# Patient Record
Sex: Female | Born: 1955 | Race: White | Hispanic: No | Marital: Married | Smoking: Former smoker
Health system: Southern US, Community
[De-identification: ages and names within clinical notes are randomized; demographics above are authoritative.]

## PROBLEM LIST (undated history)

## (undated) DIAGNOSIS — L409 Psoriasis, unspecified: Secondary | ICD-10-CM

## (undated) DIAGNOSIS — Z8719 Personal history of other diseases of the digestive system: Secondary | ICD-10-CM

## (undated) DIAGNOSIS — R0981 Nasal congestion: Secondary | ICD-10-CM

## (undated) DIAGNOSIS — M1990 Unspecified osteoarthritis, unspecified site: Secondary | ICD-10-CM

## (undated) DIAGNOSIS — M810 Age-related osteoporosis without current pathological fracture: Secondary | ICD-10-CM

## (undated) DIAGNOSIS — E785 Hyperlipidemia, unspecified: Secondary | ICD-10-CM

## (undated) DIAGNOSIS — F419 Anxiety disorder, unspecified: Secondary | ICD-10-CM

## (undated) HISTORY — PX: NOVASURE ABLATION: SHX5394

## (undated) HISTORY — DX: Age-related osteoporosis without current pathological fracture: M81.0

## (undated) HISTORY — DX: Nasal congestion: R09.81

## (undated) HISTORY — DX: Psoriasis, unspecified: L40.9

## (undated) HISTORY — PX: CHOLECYSTECTOMY: SHX55

## (undated) HISTORY — PX: APPENDECTOMY: SHX54

---

## 1986-04-25 HISTORY — PX: EXPLORATORY LAPAROTOMY: SUR591

## 1991-04-26 HISTORY — PX: GALLBLADDER SURGERY: SHX652

## 2001-04-30 ENCOUNTER — Other Ambulatory Visit: Admission: RE | Admit: 2001-04-30 | Discharge: 2001-04-30 | Payer: Self-pay | Admitting: *Deleted

## 2004-03-26 ENCOUNTER — Ambulatory Visit: Payer: Self-pay | Admitting: Internal Medicine

## 2004-04-22 ENCOUNTER — Ambulatory Visit: Payer: Self-pay | Admitting: Internal Medicine

## 2004-06-07 ENCOUNTER — Ambulatory Visit: Payer: Self-pay | Admitting: Internal Medicine

## 2005-05-23 ENCOUNTER — Ambulatory Visit: Payer: Self-pay | Admitting: Internal Medicine

## 2006-01-26 ENCOUNTER — Ambulatory Visit: Payer: Self-pay | Admitting: Internal Medicine

## 2006-08-08 ENCOUNTER — Ambulatory Visit: Payer: Self-pay | Admitting: Internal Medicine

## 2006-08-08 LAB — CONVERTED CEMR LAB
AST: 21 units/L (ref 0–37)
Albumin: 3.8 g/dL (ref 3.5–5.2)
Alkaline Phosphatase: 78 units/L (ref 39–117)
Basophils Absolute: 0 10*3/uL (ref 0.0–0.1)
Basophils Relative: 1 % (ref 0.0–1.0)
CO2: 31 meq/L (ref 19–32)
Chloride: 107 meq/L (ref 96–112)
Cholesterol: 221 mg/dL (ref 0–200)
Creatinine, Ser: 0.6 mg/dL (ref 0.4–1.2)
Eosinophils Relative: 3.6 % (ref 0.0–5.0)
GFR calc Af Amer: 137 mL/min
HCT: 39.7 % (ref 36.0–46.0)
Hemoglobin, Urine: NEGATIVE
Ketones, ur: NEGATIVE mg/dL
MCV: 89.4 fL (ref 78.0–100.0)
Neutro Abs: 2.3 10*3/uL (ref 1.4–7.7)
RBC: 4.44 M/uL (ref 3.87–5.11)
RDW: 11.5 % (ref 11.5–14.6)
Sodium: 142 meq/L (ref 135–145)
Specific Gravity, Urine: 1.025 (ref 1.000–1.03)
Total Bilirubin: 0.9 mg/dL (ref 0.3–1.2)
Total Protein, Urine: NEGATIVE mg/dL
Urine Glucose: NEGATIVE mg/dL
Urobilinogen, UA: 0.2 (ref 0.0–1.0)
VLDL: 26 mg/dL (ref 0–40)
WBC: 4.4 10*3/uL — ABNORMAL LOW (ref 4.5–10.5)
pH: 6 (ref 5.0–8.0)

## 2006-08-11 ENCOUNTER — Ambulatory Visit: Payer: Self-pay | Admitting: Internal Medicine

## 2007-03-28 ENCOUNTER — Telehealth: Payer: Self-pay | Admitting: Internal Medicine

## 2007-03-29 ENCOUNTER — Telehealth: Payer: Self-pay | Admitting: Internal Medicine

## 2007-04-26 LAB — CONVERTED CEMR LAB

## 2007-05-26 ENCOUNTER — Encounter: Payer: Self-pay | Admitting: *Deleted

## 2007-05-26 DIAGNOSIS — E785 Hyperlipidemia, unspecified: Secondary | ICD-10-CM | POA: Insufficient documentation

## 2007-05-26 DIAGNOSIS — Z9089 Acquired absence of other organs: Secondary | ICD-10-CM | POA: Insufficient documentation

## 2007-05-26 DIAGNOSIS — M26609 Unspecified temporomandibular joint disorder, unspecified side: Secondary | ICD-10-CM | POA: Insufficient documentation

## 2007-08-13 ENCOUNTER — Telehealth: Payer: Self-pay | Admitting: Internal Medicine

## 2007-10-25 ENCOUNTER — Telehealth: Payer: Self-pay | Admitting: Internal Medicine

## 2008-01-23 ENCOUNTER — Telehealth: Payer: Self-pay | Admitting: Internal Medicine

## 2008-02-12 ENCOUNTER — Ambulatory Visit: Payer: Self-pay | Admitting: Internal Medicine

## 2008-02-12 DIAGNOSIS — J069 Acute upper respiratory infection, unspecified: Secondary | ICD-10-CM | POA: Insufficient documentation

## 2008-02-12 LAB — CONVERTED CEMR LAB
AST: 24 units/L (ref 0–37)
Albumin: 4.2 g/dL (ref 3.5–5.2)
Alkaline Phosphatase: 77 units/L (ref 39–117)
Basophils Absolute: 0 10*3/uL (ref 0.0–0.1)
Bilirubin, Direct: 0.2 mg/dL (ref 0.0–0.3)
Calcium: 9.2 mg/dL (ref 8.4–10.5)
Creatinine, Ser: 0.5 mg/dL (ref 0.4–1.2)
Eosinophils Absolute: 0.1 10*3/uL (ref 0.0–0.7)
GFR calc Af Amer: 168 mL/min
GFR calc non Af Amer: 139 mL/min
Glucose, Bld: 81 mg/dL (ref 70–99)
MCHC: 35.6 g/dL (ref 30.0–36.0)
Monocytes Absolute: 0.9 10*3/uL (ref 0.1–1.0)
Potassium: 4.3 meq/L (ref 3.5–5.1)
RBC: 4.53 M/uL (ref 3.87–5.11)
RDW: 11.4 % — ABNORMAL LOW (ref 11.5–14.6)
Sodium: 142 meq/L (ref 135–145)
TSH: 1.28 microintl units/mL (ref 0.35–5.50)
Total Bilirubin: 1 mg/dL (ref 0.3–1.2)
Total CHOL/HDL Ratio: 3
WBC: 8.4 10*3/uL (ref 4.5–10.5)

## 2008-02-13 ENCOUNTER — Telehealth: Payer: Self-pay | Admitting: Internal Medicine

## 2008-02-25 ENCOUNTER — Ambulatory Visit: Payer: Self-pay | Admitting: Internal Medicine

## 2008-02-25 DIAGNOSIS — R1011 Right upper quadrant pain: Secondary | ICD-10-CM | POA: Insufficient documentation

## 2008-02-26 ENCOUNTER — Encounter: Payer: Self-pay | Admitting: Internal Medicine

## 2008-03-24 ENCOUNTER — Encounter: Admission: RE | Admit: 2008-03-24 | Discharge: 2008-03-24 | Payer: Self-pay | Admitting: Internal Medicine

## 2008-03-31 ENCOUNTER — Encounter: Payer: Self-pay | Admitting: Internal Medicine

## 2008-04-02 ENCOUNTER — Ambulatory Visit: Payer: Self-pay | Admitting: Internal Medicine

## 2008-04-08 ENCOUNTER — Telehealth: Payer: Self-pay | Admitting: Internal Medicine

## 2008-04-09 ENCOUNTER — Encounter: Payer: Self-pay | Admitting: Internal Medicine

## 2008-04-09 ENCOUNTER — Ambulatory Visit: Payer: Self-pay | Admitting: Internal Medicine

## 2008-04-09 DIAGNOSIS — M5412 Radiculopathy, cervical region: Secondary | ICD-10-CM | POA: Insufficient documentation

## 2008-04-11 ENCOUNTER — Encounter: Admission: RE | Admit: 2008-04-11 | Discharge: 2008-04-11 | Payer: Self-pay | Admitting: Internal Medicine

## 2008-04-13 ENCOUNTER — Encounter: Payer: Self-pay | Admitting: Internal Medicine

## 2008-04-15 ENCOUNTER — Telehealth: Payer: Self-pay | Admitting: Internal Medicine

## 2008-04-16 ENCOUNTER — Ambulatory Visit: Payer: Self-pay | Admitting: Internal Medicine

## 2008-05-01 ENCOUNTER — Encounter: Payer: Self-pay | Admitting: Internal Medicine

## 2008-09-02 ENCOUNTER — Telehealth: Payer: Self-pay | Admitting: Internal Medicine

## 2008-12-02 ENCOUNTER — Ambulatory Visit: Payer: Self-pay | Admitting: Internal Medicine

## 2008-12-24 ENCOUNTER — Ambulatory Visit: Payer: Self-pay | Admitting: Internal Medicine

## 2009-02-26 ENCOUNTER — Ambulatory Visit: Payer: Self-pay | Admitting: Obstetrics and Gynecology

## 2009-03-02 ENCOUNTER — Telehealth: Payer: Self-pay | Admitting: Internal Medicine

## 2009-08-22 ENCOUNTER — Telehealth: Payer: Self-pay | Admitting: Internal Medicine

## 2010-05-16 ENCOUNTER — Encounter: Payer: Self-pay | Admitting: Internal Medicine

## 2010-05-25 NOTE — Progress Notes (Signed)
Summary: REFILLs  Phone Note Refill Request   Refills Requested: Medication #1:  SIMVASTATIN 40 MG TABS 1 by mouth qPM for cholesterol  Medication #2:  ASPIRIN 81 MG  TABS Take 1 tablet by mouth once a day LAST office visit w/Owynn Mosqueda 02/2008. When is pt due for f/u? Ok for refills?   Initial call taken by: Lamar Sprinkles, CMA,  August 22, 2009 11:38 AM  Follow-up for Phone Call        OK for refills. Needs CPX OV with labs Follow-up by: Jacques Navy MD,  Aug 24, 2009 8:41 AM  Additional Follow-up for Phone Call Additional follow up Details #1::        Asprin should have been alprazolam. Ok to fill?  Additional Follow-up by: Lamar Sprinkles, CMA,  Aug 24, 2009 8:45 AM    Additional Follow-up for Phone Call Additional follow up Details #2::    yes. Follow-up by: Jacques Navy MD,  Aug 24, 2009 12:48 PM  Prescriptions: Prudy Feeler 0.25 MG  TABS (ALPRAZOLAM) Take one tablet once daily as needed  #30 x 5   Entered by:   Lamar Sprinkles, CMA   Authorized by:   Jacques Navy MD   Signed by:   Lamar Sprinkles, CMA on 08/24/2009   Method used:   Telephoned to ...       [address]  [place]. [phone]* (retail)       [address]       [place], [place]  [address]       Ph: [phone]       Fax: [phone]   RxID:   [id number] SIMVASTATIN 40 MG TABS (SIMVASTATIN) 1 by mouth qPM for cholesterol  #30 Tablet x 5   Entered by:   Lamar Sprinkles, CMA   Authorized by:   Jacques Navy MD   Signed by:   Lamar Sprinkles, CMA on 08/24/2009   Method used:   Telephoned to ...       [address]  [place]. [phone]* (retail)       [address]       [place], [place]  [address]       Ph: [phone]       Fax: [phone]   RxID:   [id number]

## 2010-09-10 NOTE — Assessment & Plan Note (Signed)
[facility]                           PRIMARY CARE OFFICE NOTE   NAME:TILLEYVincent, Tabitha Knight                   MRN:          [id number]  DATE:08/11/2006                            DOB:          1956/03/04    [name] is a 55 year old woman who presents for evaluation. She was  last seen January 26, 2006 for an upper respiratory infection with  productive cough. She reports the interval history is otherwise  unremarkable.   PAST MEDICAL HISTORY:  Well documented in my note April 22, 2004 with  no significant changes.   SOCIAL HISTORY:  The patient did go to [place] in 2007. She is now in a 4-  year relationship and it seems stable. Her son is now in college  majoring in Armed forces operational officer.   CURRENT MEDICATIONS:  1. Allegra 180 mg daily p.r.n.  2. Zocor 20 mg daily.  3. Xanax 0.25 mg q.6 p.r.n.  4. Flonase as needed.   REVIEW OF SYSTEMS:  Negative for any fevers, sweats, chills or other  constitutional symptoms. The last eye exam was May 2007. ENT,  cardiovascular, and respiratory review are negative. The patient does  have occasional heartburn no requiring medication. No GU complaints. She  does have occasional knee pain. No musculoskeletal complaints.   PHYSICAL EXAMINATION:  VITAL SIGNS:  Temperature was 98.4, blood  pressure 137/88, pulse 115, weight 128.  GENERAL:  This is a well-nourished, well-developed woman in no acute  distress.  HEENT:  Normocephalic, atraumatic. EACs and TMs were unremarkable.  Oropharynx with native dentition in good repair. No buccal or palatal  lesions were noted. The posterior pharynx was clear. The conjunctiva and  sclera was clear. PERRLA, EOMI.  Funduscopic exam was unremarkable.  NECK:  Supple without thyromegaly.  NODES:  No adenopathy was noted in the cervical or supraclavicular  regions.  CHEST:  No CVA tenderness.  LUNGS:  Clear to auscultation and percussion.  BREASTS:  Deferred to gynecology.  CARDIOVASCULAR:  2+ radial pulses, no JVD or carotid bruit. She had a  quiet precordium with a regular rate and rhythm without murmurs, rubs or  gallops.  ABDOMEN:  Soft, no guarding, no rebound. No organosplenomegaly was  appreciated.  PELVIC/RECTAL:  Deferred.  EXTREMITIES:  Without clubbing, cyanosis, edema or deformity.   LABORATORY DATA:  Hemoglobin 14 grams, white count was 4400 with a  normal differential. Chemistries were unremarkable with a glucose of 90.  Kidney function normal with a creatinine of 0.6. Liver functions were  normal. Cholesterol 221, triglycerides 129, HDL was excellent at 77.2.  LDL was 122.6. Thyroid function normal with a TSH of 1.69. Urinalysis  with 5-10 WBCs, 1+ mucous, 11-15 epithelial cells and trace bacteria.   ASSESSMENT/PLAN:  1. Lipids. The patient reports she has not been taking full dose Zocor      and even on this her LDL cholesterol is at reasonable goal for a      woman with low risk for cardiac disease. Plan is for the patient to      continue Zocor.  2. Health maintenance. The patient reports she last  had a Pap smear in      January 2008 with Dr. Logan Bores in [place]. Her last mammogram was      from December 2006 and the patient is going to schedule herself for      a mammogram in followup. The patient will be a candidate in the      next year for colorectal cancer screening with colonoscopy and we      will be happy to arrange this for her at her convenience.   All the patient's prescriptions are renewed.   In summary, this is a very pleasant woman who is medically stable at  this time. I have asked her to return to see me in one year or on a  p.r.n. basis.     Rosalyn Gess Norins, MD  Electronically Signed    MEN/MedQ  DD: 08/14/2006  DT: 08/14/2006  Job #: [id number]   cc:   [name]

## 2011-03-22 ENCOUNTER — Ambulatory Visit: Payer: Self-pay | Admitting: Obstetrics and Gynecology

## 2012-05-02 ENCOUNTER — Ambulatory Visit: Payer: Self-pay | Admitting: Obstetrics and Gynecology

## 2013-06-24 ENCOUNTER — Ambulatory Visit: Payer: Self-pay | Admitting: Obstetrics and Gynecology

## 2013-12-01 DIAGNOSIS — F419 Anxiety disorder, unspecified: Secondary | ICD-10-CM | POA: Insufficient documentation

## 2013-12-01 DIAGNOSIS — J309 Allergic rhinitis, unspecified: Secondary | ICD-10-CM | POA: Insufficient documentation

## 2013-12-01 DIAGNOSIS — L409 Psoriasis, unspecified: Secondary | ICD-10-CM | POA: Insufficient documentation

## 2014-12-22 ENCOUNTER — Other Ambulatory Visit: Payer: Self-pay | Admitting: Internal Medicine

## 2014-12-22 DIAGNOSIS — Z0000 Encounter for general adult medical examination without abnormal findings: Secondary | ICD-10-CM | POA: Insufficient documentation

## 2014-12-22 DIAGNOSIS — Z1231 Encounter for screening mammogram for malignant neoplasm of breast: Secondary | ICD-10-CM

## 2014-12-30 ENCOUNTER — Ambulatory Visit
Admission: RE | Admit: 2014-12-30 | Discharge: 2014-12-30 | Disposition: A | Discharge status: Home / Self Care | Payer: 59 | Source: Ambulatory Visit | Attending: Internal Medicine | Admitting: Internal Medicine

## 2014-12-30 DIAGNOSIS — Z1231 Encounter for screening mammogram for malignant neoplasm of breast: Secondary | ICD-10-CM

## 2015-03-11 ENCOUNTER — Encounter: Payer: Self-pay | Admitting: Internal Medicine

## 2015-12-23 DIAGNOSIS — M810 Age-related osteoporosis without current pathological fracture: Secondary | ICD-10-CM | POA: Insufficient documentation

## 2015-12-23 DIAGNOSIS — R030 Elevated blood-pressure reading, without diagnosis of hypertension: Secondary | ICD-10-CM | POA: Insufficient documentation

## 2016-03-25 ENCOUNTER — Other Ambulatory Visit: Payer: Self-pay | Admitting: Internal Medicine

## 2016-03-25 DIAGNOSIS — Z1231 Encounter for screening mammogram for malignant neoplasm of breast: Secondary | ICD-10-CM

## 2016-04-06 ENCOUNTER — Ambulatory Visit
Admission: RE | Admit: 2016-04-06 | Discharge: 2016-04-06 | Disposition: A | Discharge status: Home / Self Care | Payer: 59 | Source: Ambulatory Visit | Attending: Internal Medicine | Admitting: Internal Medicine

## 2016-04-06 DIAGNOSIS — Z1231 Encounter for screening mammogram for malignant neoplasm of breast: Secondary | ICD-10-CM | POA: Insufficient documentation

## 2016-05-03 ENCOUNTER — Ambulatory Visit: Payer: 59

## 2016-07-21 ENCOUNTER — Encounter: Payer: Self-pay | Admitting: Obstetrics and Gynecology

## 2016-09-20 ENCOUNTER — Encounter: Payer: Self-pay | Admitting: Obstetrics and Gynecology

## 2016-09-20 ENCOUNTER — Ambulatory Visit (INDEPENDENT_AMBULATORY_CARE_PROVIDER_SITE_OTHER): Payer: BLUE CROSS/BLUE SHIELD | Admitting: Obstetrics and Gynecology

## 2016-09-20 VITALS — BP 138/82 | HR 82 | Ht 61.0 in | Wt 131.5 lb

## 2016-09-20 DIAGNOSIS — Z01419 Encounter for gynecological examination (general) (routine) without abnormal findings: Secondary | ICD-10-CM | POA: Diagnosis not present

## 2016-09-20 MED ORDER — ALPRAZOLAM 0.25 MG PO TABS: 0.2500 mg | ORAL_TABLET | Freq: Two times a day (BID) | ORAL | 2 refills | Status: DC | PRN

## 2016-09-20 NOTE — Patient Instructions (Signed)
Place annual gynecologic exam patient instructions here.

## 2016-09-20 NOTE — Progress Notes (Signed)
Subjective:   Tabitha Knight is a 61 y.o. G38P1 Caucasian female here for a routine well-woman exam.  No LMP recorded. Patient has had an ablation.    Current complaints: none PCP: Ouida Sills       doesn't desire labs  Social History: Sexual: heterosexual Marital Status: married Living situation: with spouse Occupation: retired Tobacco/alcohol: smoking 4-5 cigs/days, and occasional alcohol intake. Illicit drugs: no history of illicit drug use  The following portions of the patient's history were reviewed and updated as appropriate: allergies, current medications, past family history, past medical history, past social history, past surgical history and problem list.  Past Medical History Past Medical History:  Diagnosis Date  . Osteoporosis   . Psoriasis   . Sinus congestion     Past Surgical History Past Surgical History:  Procedure Laterality Date  . [place]    Gynecologic History G1P1  No LMP recorded. Patient has had an ablation. Contraception: post menopausal status Last Pap: 2014. Results were: normal Last mammogram: 2018. Results were: normal   Obstetric History OB History  Gravida Para Term Preterm AB Living  1 1          SAB TAB Ectopic Multiple Live Births               # Outcome Date GA Lbr Len/2nd Weight Sex Delivery Anes PTL Lv  1 Para 1988    M Vag-Spont         Current Medications No current outpatient prescriptions on file prior to visit.   No current facility-administered medications on file prior to visit.     Review of Systems Patient denies any headaches, blurred vision, shortness of breath, chest pain, abdominal pain, problems with bowel movements, urination, or intercourse.  Objective:  BP 138/82   Pulse 82   Ht 5\' 1"  (1.549 m)   Wt 131 lb 8 oz (59.6 kg)   BMI 24.85 kg/m  Physical Exam  General:  Well developed, well nourished, no acute distress. She is alert and oriented x3. Skin:  Warm and dry Neck:  Midline  trachea, no thyromegaly or nodules Cardiovascular: Regular rate and rhythm, no murmur heard Lungs:  Effort normal, all lung fields clear to auscultation bilaterally Breasts:  No dominant palpable mass, retraction, or nipple discharge Abdomen:  Soft, non tender, no hepatosplenomegaly or masses Pelvic:  External genitalia is normal in appearance.  The vagina is normal in appearance. The cervix is bulbous, no CMT.  Thin prep pap is not done. Uterus is felt to be normal size, shape, and contour.  No adnexal masses or tenderness noted. Extremities:  No swelling or varicosities noted Psych:  She has a normal mood and affect  Assessment:   Healthy well-woman exam S/p menopause Elevated cholesterol Anxiety    Plan:   F/U 1 year for AE, or sooner if needed   Keen Ewalt Rockney Ghee, CNM

## 2017-02-16 ENCOUNTER — Other Ambulatory Visit: Payer: Self-pay | Admitting: *Deleted

## 2017-02-16 MED ORDER — ALPRAZOLAM 0.25 MG PO TABS: 0.2500 mg | ORAL_TABLET | Freq: Two times a day (BID) | ORAL | 2 refills | Status: DC | PRN

## 2017-02-24 ENCOUNTER — Other Ambulatory Visit: Payer: Self-pay | Admitting: Internal Medicine

## 2017-02-24 DIAGNOSIS — Z1231 Encounter for screening mammogram for malignant neoplasm of breast: Secondary | ICD-10-CM

## 2017-06-12 ENCOUNTER — Telehealth: Payer: Self-pay | Admitting: Obstetrics and Gynecology

## 2017-06-12 NOTE — Telephone Encounter (Signed)
The patient called and stated that she is waiting on approval for her prescription at [facility]. The patient would like a call back. Please advise.

## 2017-06-14 ENCOUNTER — Ambulatory Visit
Admission: RE | Admit: 2017-06-14 | Discharge: 2017-06-14 | Disposition: A | Discharge status: Home / Self Care | Payer: BLUE CROSS/BLUE SHIELD | Source: Ambulatory Visit | Attending: Internal Medicine | Admitting: Internal Medicine

## 2017-06-14 ENCOUNTER — Telehealth: Payer: Self-pay | Admitting: Obstetrics and Gynecology

## 2017-06-14 DIAGNOSIS — Z1231 Encounter for screening mammogram for malignant neoplasm of breast: Secondary | ICD-10-CM | POA: Insufficient documentation

## 2017-06-14 NOTE — Telephone Encounter (Signed)
Pt aware MNS out of office until 07/04/17. She request another provider refill her xanax. Advised I will ask.

## 2017-06-14 NOTE — Telephone Encounter (Signed)
The patient called and stated that she is waiting on approval for her prescription at [facility]. The patient would like a call back. Please advise.   This message was sent back on Monday and the patient did not get a response. The patient would like to speak with a nurse if possible to get this taken care of.

## 2017-06-15 MED ORDER — ALPRAZOLAM 0.25 MG PO TABS: 0.2500 mg | ORAL_TABLET | Freq: Two times a day (BID) | ORAL | 0 refills | Status: DC | PRN

## 2017-06-15 NOTE — Telephone Encounter (Signed)
Pt aware rx left up front for p/u.

## 2017-06-15 NOTE — Telephone Encounter (Signed)
May have single refill until MNS returns to office. JML

## 2017-06-19 NOTE — Telephone Encounter (Signed)
Done by [facility]

## 2017-08-19 ENCOUNTER — Other Ambulatory Visit: Payer: Self-pay | Admitting: Certified Nurse Midwife

## 2017-09-11 ENCOUNTER — Telehealth: Payer: Self-pay | Admitting: Obstetrics and Gynecology

## 2017-09-11 ENCOUNTER — Other Ambulatory Visit: Payer: Self-pay | Admitting: *Deleted

## 2017-09-11 MED ORDER — ALPRAZOLAM 0.25 MG PO TABS: 0.2500 mg | ORAL_TABLET | Freq: Two times a day (BID) | ORAL | 2 refills | Status: DC | PRN

## 2017-09-11 NOTE — Telephone Encounter (Signed)
The patient called and stated that she would like to have her medication refilled ALPRAZolam (XANAX) 0.25 MG tablet, The patient would like a call back as well if possible this afternoon to confirm the refill. Please advise.

## 2017-09-11 NOTE — Telephone Encounter (Signed)
MNS out of office 09/11/17, will have MNS sign tomorrow

## 2017-09-21 ENCOUNTER — Encounter: Payer: Self-pay | Admitting: Obstetrics and Gynecology

## 2017-09-21 ENCOUNTER — Other Ambulatory Visit: Payer: Self-pay | Admitting: Obstetrics and Gynecology

## 2017-09-21 ENCOUNTER — Ambulatory Visit (INDEPENDENT_AMBULATORY_CARE_PROVIDER_SITE_OTHER): Payer: BLUE CROSS/BLUE SHIELD | Admitting: Obstetrics and Gynecology

## 2017-09-21 VITALS — BP 137/83 | HR 85 | Ht 61.0 in | Wt 127.3 lb

## 2017-09-21 DIAGNOSIS — Z01419 Encounter for gynecological examination (general) (routine) without abnormal findings: Secondary | ICD-10-CM

## 2017-09-21 NOTE — Progress Notes (Signed)
Subjective:   Tabitha Knight is a 61 y.o. G78P1 white female here for a routine well-woman exam.  No LMP recorded. Patient has had an ablation.    Current complaints: none PCP: Ouida Sills         Social History: Sexual: heterosexual Marital Status: married Living situation: with spouse Occupation: retired Tobacco/alcohol: no tobacco use Illicit drugs: no history of illicit drug use  The following portions of the patient's history were reviewed and updated as appropriate: allergies, current medications, past family history, past medical history, past social history, past surgical history and problem list.  Past Medical History Past Medical History:  Diagnosis Date  . Osteoporosis   . Psoriasis   . Sinus congestion     Past Surgical History Past Surgical History:  Procedure Laterality Date  . [place]    Gynecologic History G1P1  No LMP recorded. Patient has had an ablation. Contraception: post menopausal status Last Pap: ?Marland Kitchen Results were: normal Last mammogram: 05/2017. Results were: normal   Obstetric History OB History  Gravida Para Term Preterm AB Living  1 1          SAB TAB Ectopic Multiple Live Births               # Outcome Date GA Lbr Len/2nd Weight Sex Delivery Anes PTL Lv  1 Para 1988    M Vag-Spont       Current Medications Current Outpatient Medications on File Prior to Visit  Medication Sig Dispense Refill  . alendronate (FOSAMAX) 70 MG tablet Take 70 mg by mouth once a week. Take with a full glass of water on an empty stomach.    . ALPRAZolam (XANAX) 0.25 MG tablet Take 1 tablet (0.25 mg total) by mouth 2 (two) times daily as needed for anxiety. 45 tablet 2  . atorvastatin (LIPITOR) 80 MG tablet Take 80 mg by mouth daily.    . clobetasol ointment (TEMOVATE) 0.53 % Apply 1 application topically 2 (two) times daily.    Marland Kitchen desonide (DESOWEN) 0.05 % cream Apply topically 2 (two) times daily.    . fexofenadine (ALLEGRA) 60 MG tablet Take  60 mg by mouth 2 (two) times daily.    . Vitamin D, Cholecalciferol, 1000 units CAPS Take by mouth.     No current facility-administered medications on file prior to visit.     Review of Systems Patient denies any headaches, blurred vision, shortness of breath, chest pain, abdominal pain, problems with bowel movements, urination, or intercourse.  Objective:  BP 137/83   Pulse 85   Ht 5\' 1"  (1.549 m)   Wt 127 lb 4.8 oz (57.7 kg)   BMI 24.05 kg/m  Physical Exam  General:  Well developed, well nourished, no acute distress. She is alert and oriented x3. Skin:  Warm and dry Neck:  Midline trachea, no thyromegaly or nodules Cardiovascular: Regular rate and rhythm, no murmur heard Lungs:  Effort normal, all lung fields clear to auscultation bilaterally Breasts:  No dominant palpable mass, retraction, or nipple discharge Abdomen:  Soft, non tender, no hepatosplenomegaly or masses Pelvic:  External genitalia is normal in appearance.  The vagina is normal in appearance. The cervix is bulbous, no CMT.  Thin prep pap is done with HR HPV cotesting. Uterus is felt to be normal size, shape, and contour.  No adnexal masses or tenderness noted. Extremities:  No swelling or varicosities noted Psych:  She has a normal mood and affect  Assessment:  Healthy well-woman exam S/p menopause Osteopenia Elevated cholesterol  Plan:  Pap obtained will follow up accordingly F/U 1 year for AE, or sooner if needed Mammogram done this year Colonoscopy & BDS due this year-PCP typically  Melody Rockney Ghee, CNM

## 2017-09-22 LAB — CYTOLOGY - PAP

## 2018-02-01 ENCOUNTER — Other Ambulatory Visit: Payer: Self-pay | Admitting: Obstetrics and Gynecology

## 2018-08-09 ENCOUNTER — Other Ambulatory Visit: Payer: Self-pay | Admitting: Obstetrics and Gynecology

## 2018-08-13 ENCOUNTER — Telehealth: Payer: Self-pay | Admitting: Obstetrics and Gynecology

## 2018-08-13 NOTE — Telephone Encounter (Signed)
The patient called and stated that she is currently out of refills for her prescription ALPRAZolam (XANAX) 0.25 MG tablet [324]. The patient is requesting to have a refill sent into her pharmacy. Please advise.

## 2018-09-24 ENCOUNTER — Telehealth: Payer: Self-pay

## 2018-09-24 NOTE — Telephone Encounter (Signed)
Coronavirus (COVID-19) Are you at risk?  Are you at risk for the Coronavirus (COVID-19)?  To be considered HIGH RISK for Coronavirus (COVID-19), you have to meet the following criteria:  . Traveled to [place], [place], [place], [place] or [place]; or in the [place] to [place], [place], [place], or [place]; and have fever, cough, and shortness of breath within the last 2 weeks of travel OR . Been in close contact with a person diagnosed with COVID-19 within the last 2 weeks and have fever, cough, and shortness of breath . IF YOU DO NOT MEET THESE CRITERIA, YOU ARE CONSIDERED LOW RISK FOR COVID-19.  What to do if you are HIGH RISK for COVID-19?  Marland Kitchen If you are having a medical emergency, call 911. . Seek medical care right away. Before you go to a doctor's office, urgent care or emergency department, call ahead and tell them about your recent travel, contact with someone diagnosed with COVID-19, and your symptoms. You should receive instructions from your physician's office regarding next steps of care.  . When you arrive at healthcare provider, tell the healthcare staff immediately you have returned from visiting [place], [place], [place], [place] or [place]; or traveled in the [place] to [place], [place], [place], or [place]; in the last two weeks or you have been in close contact with a person diagnosed with COVID-19 in the last 2 weeks.   . Tell the health care staff about your symptoms: fever, cough and shortness of breath. . After you have been seen by a medical provider, you will be either: o Tested for (COVID-19) and discharged home on quarantine except to seek medical care if symptoms worsen, and asked to  - Stay home and avoid contact with others until you get your results (4-5 days)  - Avoid travel on public transportation if possible (such as bus, train, or airplane) or o Sent to the Emergency Department by EMS for evaluation, COVID-19 testing, and possible  admission depending on your condition and test results.  What to do if you are LOW RISK for COVID-19?  Reduce your risk of any infection by using the same precautions used for avoiding the common cold or flu:  Marland Kitchen Wash your hands often with soap and warm water for at least 20 seconds.  If soap and water are not readily available, use an alcohol-based hand sanitizer with at least 60% alcohol.  . If coughing or sneezing, cover your mouth and nose by coughing or sneezing into the elbow areas of your shirt or coat, into a tissue or into your sleeve (not your hands). . Avoid shaking hands with others and consider head nods or verbal greetings only. . Avoid touching your eyes, nose, or mouth with unwashed hands.  . Avoid close contact with people who are Tabitha Knight. . Avoid places or events with large numbers of people in one location, like concerts or sporting events. . Carefully consider travel plans you have or are making. . If you are planning any travel outside or inside the [place], visit the CDC's Travelers' Health webpage for the latest health notices. . If you have some symptoms but not all symptoms, continue to monitor at home and seek medical attention if your symptoms worsen. . If you are having a medical emergency, call 911.  09/24/18 SCREENING NEG SLS ADDITIONAL HEALTHCARE OPTIONS FOR PATIENTS  [place] [facility] / e-Visit: [email or web address]         [facility]: [phone]  [facility]: [facility]: [phone]

## 2018-09-25 ENCOUNTER — Ambulatory Visit (INDEPENDENT_AMBULATORY_CARE_PROVIDER_SITE_OTHER): Payer: BC Managed Care – PPO | Admitting: Obstetrics and Gynecology

## 2018-09-25 ENCOUNTER — Other Ambulatory Visit: Payer: Self-pay

## 2018-09-25 ENCOUNTER — Encounter: Payer: Self-pay | Admitting: Obstetrics and Gynecology

## 2018-09-25 VITALS — BP 132/84 | HR 88 | Ht 61.0 in | Wt 130.7 lb

## 2018-09-25 DIAGNOSIS — Z01419 Encounter for gynecological examination (general) (routine) without abnormal findings: Secondary | ICD-10-CM

## 2018-09-25 MED ORDER — ALPRAZOLAM 0.25 MG PO TABS: 0.2500 mg | ORAL_TABLET | Freq: Two times a day (BID) | ORAL | 4 refills | Status: DC | PRN

## 2018-09-25 NOTE — Progress Notes (Signed)
Subjective:   Tabitha Knight is a 63 y.o. G46P1 Caucasian female here for a routine well-woman exam.  No LMP recorded. Patient has had an ablation.    Current complaints: none PCP: Ouida Sills       PCP does labs  Social History: Sexual: heterosexual Marital Status: married Living situation: with spouse Occupation: retired Tobacco/alcohol: no tobacco use Illicit drugs: no history of illicit drug use  The following portions of the patient's history were reviewed and updated as appropriate: allergies, current medications, past family history, past medical history, past social history, past surgical history and problem list.  Past Medical History Past Medical History:  Diagnosis Date  . Osteoporosis   . Psoriasis   . Sinus congestion     Past Surgical History Past Surgical History:  Procedure Laterality Date  . [place]    Gynecologic History G1P1  No LMP recorded. Patient has had an ablation. Contraception: post menopausal status Last Pap: 2019. Results were: normal Last mammogram: 05/2017. Results were: normal   Obstetric History OB History  Gravida Para Term Preterm AB Living  1 1          SAB TAB Ectopic Multiple Live Births               # Outcome Date GA Lbr Len/2nd Weight Sex Delivery Anes PTL Lv  1 Para 1988    M Vag-Spont       Current Medications Current Outpatient Medications on File Prior to Visit  Medication Sig Dispense Refill  . ALPRAZolam (XANAX) 0.25 MG tablet TAKE 1 TABLET BY MOUTH TWICE DAILY AS NEEDED FOR ANXIETY 45 tablet 0  . atorvastatin (LIPITOR) 80 MG tablet Take 80 mg by mouth daily.    . fexofenadine (ALLEGRA) 60 MG tablet Take 60 mg by mouth 2 (two) times daily.    Marland Kitchen gabapentin (NEURONTIN) 100 MG capsule Take 100 mg by mouth 3 (three) times daily.    Marland Kitchen pyridOXINE (VITAMIN B-6) 100 MG tablet Take 100 mg by mouth daily.    . Vitamin D, Cholecalciferol, 1000 units CAPS Take by mouth.    Marland Kitchen alendronate (FOSAMAX) 70 MG tablet  Take 70 mg by mouth once a week. Take with a full glass of water on an empty stomach.    . clobetasol ointment (TEMOVATE) 2.84 % Apply 1 application topically 2 (two) times daily.    Marland Kitchen desonide (DESOWEN) 0.05 % cream Apply topically 2 (two) times daily.     No current facility-administered medications on file prior to visit.     Review of Systems Patient denies any headaches, blurred vision, shortness of breath, chest pain, abdominal pain, problems with bowel movements, urination, or intercourse.  Objective:  BP 132/84   Pulse 88   Ht 5\' 1"  (1.549 m)   Wt 130 lb 11.2 oz (59.3 kg)   BMI 24.70 kg/m  Physical Exam  General:  Well developed, well nourished, no acute distress. She is alert and oriented x3. Skin:  Warm and dry Neck:  Midline trachea, no thyromegaly or nodules Cardiovascular: Regular rate and rhythm, no murmur heard Lungs:  Effort normal, all lung fields clear to auscultation bilaterally Breasts:  No dominant palpable mass, retraction, or nipple discharge Abdomen:  Soft, non tender, no hepatosplenomegaly or masses Pelvic:  External genitalia is normal in appearance.  The vagina is normal in appearance. The cervix is bulbous, no CMT.  Thin prep pap is not done . Uterus is felt to be normal size, shape,  and contour.  No adnexal masses or tenderness noted. Extremities:  No swelling or varicosities noted Psych:  She has a normal mood and affect  Assessment:   Healthy well-woman exam  Plan:   F/U 1 year for AE, or sooner if needed Mammogram due  Payden Docter Rockney Ghee, CNM

## 2018-10-22 ENCOUNTER — Other Ambulatory Visit: Payer: Self-pay | Admitting: Orthopedic Surgery

## 2018-10-22 DIAGNOSIS — M5412 Radiculopathy, cervical region: Secondary | ICD-10-CM

## 2018-10-25 ENCOUNTER — Ambulatory Visit
Admission: RE | Admit: 2018-10-25 | Discharge: 2018-10-25 | Disposition: A | Discharge status: Home / Self Care | Payer: BC Managed Care – PPO | Source: Ambulatory Visit | Attending: Orthopedic Surgery | Admitting: Orthopedic Surgery

## 2018-10-25 ENCOUNTER — Other Ambulatory Visit: Payer: Self-pay

## 2018-10-25 DIAGNOSIS — M5412 Radiculopathy, cervical region: Secondary | ICD-10-CM | POA: Insufficient documentation

## 2018-11-06 ENCOUNTER — Ambulatory Visit: Payer: BLUE CROSS/BLUE SHIELD

## 2019-01-24 ENCOUNTER — Encounter: Payer: Self-pay | Admitting: Gastroenterology

## 2019-01-25 ENCOUNTER — Other Ambulatory Visit: Payer: Self-pay | Admitting: Internal Medicine

## 2019-01-25 DIAGNOSIS — Z1231 Encounter for screening mammogram for malignant neoplasm of breast: Secondary | ICD-10-CM

## 2019-02-28 ENCOUNTER — Other Ambulatory Visit: Payer: Self-pay | Admitting: Neurosurgery

## 2019-03-06 ENCOUNTER — Ambulatory Visit
Admission: RE | Admit: 2019-03-06 | Discharge: 2019-03-06 | Disposition: A | Discharge status: Home / Self Care | Payer: BC Managed Care – PPO | Source: Ambulatory Visit | Attending: Internal Medicine | Admitting: Internal Medicine

## 2019-03-06 DIAGNOSIS — Z1231 Encounter for screening mammogram for malignant neoplasm of breast: Secondary | ICD-10-CM | POA: Diagnosis not present

## 2019-03-08 ENCOUNTER — Other Ambulatory Visit: Payer: Self-pay

## 2019-03-08 ENCOUNTER — Other Ambulatory Visit
Admission: RE | Admit: 2019-03-08 | Discharge: 2019-03-08 | Disposition: A | Discharge status: Home / Self Care | Payer: BC Managed Care – PPO | Source: Ambulatory Visit | Attending: Neurosurgery | Admitting: Neurosurgery

## 2019-03-08 DIAGNOSIS — Z01818 Encounter for other preprocedural examination: Secondary | ICD-10-CM | POA: Insufficient documentation

## 2019-03-08 DIAGNOSIS — Z20828 Contact with and (suspected) exposure to other viral communicable diseases: Secondary | ICD-10-CM | POA: Diagnosis not present

## 2019-03-08 HISTORY — DX: Hyperlipidemia, unspecified: E78.5

## 2019-03-08 HISTORY — DX: Unspecified osteoarthritis, unspecified site: M19.90

## 2019-03-08 LAB — CBC
HCT: 41.9 % (ref 36.0–46.0)
Hemoglobin: 14.9 g/dL (ref 12.0–15.0)
MCH: 31.6 pg (ref 26.0–34.0)
MCHC: 35.6 g/dL (ref 30.0–36.0)
MCV: 88.8 fL (ref 80.0–100.0)
Platelets: 210 10*3/uL (ref 150–400)
RBC: 4.72 MIL/uL (ref 3.87–5.11)
RDW: 11.2 % — ABNORMAL LOW (ref 11.5–15.5)
WBC: 5.8 10*3/uL (ref 4.0–10.5)
nRBC: 0 % (ref 0.0–0.2)

## 2019-03-08 LAB — URINALYSIS, ROUTINE W REFLEX MICROSCOPIC
Bilirubin Urine: NEGATIVE
Glucose, UA: NEGATIVE mg/dL
Hgb urine dipstick: NEGATIVE
Ketones, ur: NEGATIVE mg/dL
Leukocytes,Ua: NEGATIVE
Nitrite: NEGATIVE
Protein, ur: NEGATIVE mg/dL
Specific Gravity, Urine: 1.002 — ABNORMAL LOW (ref 1.005–1.030)
pH: 7 (ref 5.0–8.0)

## 2019-03-08 LAB — BASIC METABOLIC PANEL
Anion gap: 11 (ref 5–15)
BUN: 5 mg/dL — ABNORMAL LOW (ref 8–23)
CO2: 26 mmol/L (ref 22–32)
Calcium: 9 mg/dL (ref 8.9–10.3)
Chloride: 102 mmol/L (ref 98–111)
Creatinine, Ser: 0.51 mg/dL (ref 0.44–1.00)
GFR calc Af Amer: >60 mL/min (ref >60)
GFR calc non Af Amer: >60 mL/min (ref >60)
Glucose, Bld: 106 mg/dL — ABNORMAL HIGH (ref 70–99)
Potassium: 3.4 mmol/L — ABNORMAL LOW (ref 3.5–5.1)
Sodium: 139 mmol/L (ref 135–145)

## 2019-03-08 LAB — TYPE AND SCREEN
ABO/RH(D): B NEG
Antibody Screen: NEGATIVE

## 2019-03-08 LAB — PROTIME-INR
INR: 1 (ref 0.8–1.2)
Prothrombin Time: 12.7 seconds (ref 11.4–15.2)

## 2019-03-08 LAB — APTT: aPTT: 25 seconds (ref 24–36)

## 2019-03-08 LAB — SURGICAL PCR SCREEN
MRSA, PCR: NEGATIVE
Staphylococcus aureus: NEGATIVE

## 2019-03-08 NOTE — Patient Instructions (Signed)
Your procedure is scheduled on: Wednesday 03/13/19 Report to [place]. To find out your arrival time please call [phone] between 1PM - 3PM on Tuesday 03/12/19.  Remember: Instructions that are not followed completely may result in serious medical risk, up to and including death, or upon the discretion of your surgeon and anesthesiologist your surgery may need to be rescheduled.     _X__ 1. Do not eat food after midnight the night before your procedure.                 No gum chewing or hard candies. You may drink clear liquids up to 2 hours                 before you are scheduled to arrive for your surgery- DO not drink clear                 liquids within 2 hours of the start of your surgery.                 Clear Liquids include:  water, apple juice without pulp, clear carbohydrate                 drink such as Clearfast or Gatorade, Black Coffee or Tea (Do not add                 anything to coffee or tea). Diabetics water only  __X__2.  On the morning of surgery brush your teeth with toothpaste and water, you                 may rinse your mouth with mouthwash if you wish.  Do not swallow any              toothpaste of mouthwash.     _X__ 3.  No Alcohol for 24 hours before or after surgery.   _X__ 4.  Do Not Smoke or use e-cigarettes For 24 Hours Prior to Your Surgery.                 Do not use any chewable tobacco products for at least 6 hours prior to                 surgery.  ____  5.  Bring all medications with you on the day of surgery if instructed.   __X__  6.  Notify your doctor if there is any change in your medical condition      (cold, fever, infections).     Do not wear jewelry, make-up, hairpins, clips or nail polish. Do not wear lotions, powders, or perfumes.  Do not shave 48 hours prior to surgery. Men may shave face and neck. Do not bring valuables to the hospital.    [facility] is not responsible  for any belongings or valuables.  Contacts, dentures/partials or body piercings may not be worn into surgery. Bring a case for your contacts, glasses or hearing aids, a denture cup will be supplied. Leave your suitcase in the car. After surgery it may be brought to your room. For patients admitted to the hospital, discharge time is determined by your treatment team.   Patients discharged the day of surgery will not be allowed to drive home.   Please read over the following fact sheets that you were given:   MRSA Information  __X__ Take these medicines the morning of surgery with A SIP OF WATER:  1. cetirizine (ZYRTEC) 10 MG tablet  2. ALPRAZolam (XANAX) 0.25 MG tablet if needed  3. gabapentin (NEURONTIN) 100 MG capsule if needed  4.  5.  6.  ____ Fleet Enema (as directed)   __X__ Use CHG Soap/SAGE wipes as directed  ____ Use inhalers on the day of surgery  ____ Stop metformin/Janumet/Farxiga 2 days prior to surgery    ____ Take 1/2 of usual insulin dose the night before surgery. No insulin the morning          of surgery.   ____ Stop Blood Thinners Coumadin/Plavix/Xarelto/Pleta/Pradaxa/Eliquis/Effient/Aspirin  on   Or contact your Surgeon, Cardiologist or Medical Doctor regarding  ability to stop your blood thinners  __X__ Stop Anti-inflammatories 7 days before surgery such as Advil, Ibuprofen, Motrin,  BC or Goodies Powder, Naprosyn, Naproxen, Aleve, Aspirin    __X__ Stop all herbal supplements, fish oil or vitamin E until after surgery.    ____ Bring C-Pap to the hospital.

## 2019-03-09 LAB — SARS CORONAVIRUS 2 (TAT 6-24 HRS): SARS Coronavirus 2: NEGATIVE

## 2019-03-13 ENCOUNTER — Ambulatory Visit: Payer: BC Managed Care – PPO | Admitting: Anesthesiology

## 2019-03-13 ENCOUNTER — Encounter
Admission: RE | Disposition: A | Discharge status: Home / Self Care | Payer: Self-pay | Source: Home / Self Care | Attending: Neurosurgery

## 2019-03-13 ENCOUNTER — Other Ambulatory Visit: Payer: Self-pay

## 2019-03-13 ENCOUNTER — Ambulatory Visit: Payer: BC Managed Care – PPO

## 2019-03-13 ENCOUNTER — Observation Stay
Admission: RE | Admit: 2019-03-13 | Discharge: 2019-03-14 | Disposition: A | Discharge status: Home / Self Care | Payer: BC Managed Care – PPO | Attending: Neurosurgery | Admitting: Neurosurgery

## 2019-03-13 ENCOUNTER — Encounter: Payer: Self-pay | Admitting: *Deleted

## 2019-03-13 DIAGNOSIS — M5412 Radiculopathy, cervical region: Secondary | ICD-10-CM | POA: Diagnosis present

## 2019-03-13 DIAGNOSIS — M810 Age-related osteoporosis without current pathological fracture: Secondary | ICD-10-CM | POA: Diagnosis not present

## 2019-03-13 DIAGNOSIS — M4802 Spinal stenosis, cervical region: Secondary | ICD-10-CM | POA: Diagnosis not present

## 2019-03-13 DIAGNOSIS — Z7982 Long term (current) use of aspirin: Secondary | ICD-10-CM | POA: Diagnosis not present

## 2019-03-13 DIAGNOSIS — Z803 Family history of malignant neoplasm of breast: Secondary | ICD-10-CM | POA: Diagnosis not present

## 2019-03-13 DIAGNOSIS — F419 Anxiety disorder, unspecified: Secondary | ICD-10-CM | POA: Insufficient documentation

## 2019-03-13 DIAGNOSIS — Z981 Arthrodesis status: Secondary | ICD-10-CM

## 2019-03-13 DIAGNOSIS — Z79899 Other long term (current) drug therapy: Secondary | ICD-10-CM | POA: Insufficient documentation

## 2019-03-13 DIAGNOSIS — E785 Hyperlipidemia, unspecified: Secondary | ICD-10-CM | POA: Diagnosis not present

## 2019-03-13 DIAGNOSIS — Z8249 Family history of ischemic heart disease and other diseases of the circulatory system: Secondary | ICD-10-CM | POA: Insufficient documentation

## 2019-03-13 DIAGNOSIS — Z833 Family history of diabetes mellitus: Secondary | ICD-10-CM | POA: Diagnosis not present

## 2019-03-13 DIAGNOSIS — Z9049 Acquired absence of other specified parts of digestive tract: Secondary | ICD-10-CM | POA: Insufficient documentation

## 2019-03-13 DIAGNOSIS — L409 Psoriasis, unspecified: Secondary | ICD-10-CM | POA: Insufficient documentation

## 2019-03-13 DIAGNOSIS — Z8379 Family history of other diseases of the digestive system: Secondary | ICD-10-CM | POA: Insufficient documentation

## 2019-03-13 DIAGNOSIS — Z419 Encounter for procedure for purposes other than remedying health state, unspecified: Secondary | ICD-10-CM

## 2019-03-13 DIAGNOSIS — M1990 Unspecified osteoarthritis, unspecified site: Secondary | ICD-10-CM | POA: Insufficient documentation

## 2019-03-13 DIAGNOSIS — Z823 Family history of stroke: Secondary | ICD-10-CM | POA: Insufficient documentation

## 2019-03-13 DIAGNOSIS — F17200 Nicotine dependence, unspecified, uncomplicated: Secondary | ICD-10-CM | POA: Diagnosis not present

## 2019-03-13 HISTORY — PX: ANTERIOR CERVICAL DECOMP/DISCECTOMY FUSION: SHX1161

## 2019-03-13 LAB — ABO/RH: ABO/RH(D): B NEG

## 2019-03-13 SURGERY — ANTERIOR CERVICAL DECOMPRESSION/DISCECTOMY FUSION 2 LEVELS
Anesthesia: General

## 2019-03-13 MED ORDER — SUCCINYLCHOLINE CHLORIDE 20 MG/ML IJ SOLN
INTRAMUSCULAR | Status: DC | PRN
  Administered 2019-03-13: 100 mg via INTRAVENOUS

## 2019-03-13 MED ORDER — FENTANYL CITRATE (PF) 100 MCG/2ML IJ SOLN
INTRAMUSCULAR | Status: DC | PRN
  Administered 2019-03-13 (×2): 50 ug via INTRAVENOUS

## 2019-03-13 MED ORDER — GABAPENTIN 100 MG PO CAPS: 100.0000 mg | ORAL_CAPSULE | Freq: Three times a day (TID) | ORAL | Status: DC | PRN

## 2019-03-13 MED ORDER — ACETAMINOPHEN 650 MG RE SUPP: 650.0000 mg | RECTAL | Status: DC | PRN

## 2019-03-13 MED ORDER — EPINEPHRINE PF 1 MG/ML IJ SOLN
INTRAMUSCULAR | Status: AC
  Filled 2019-03-13: qty 1

## 2019-03-13 MED ORDER — PROPOFOL 10 MG/ML IV BOLUS
INTRAVENOUS | Status: AC
  Filled 2019-03-13: qty 20

## 2019-03-13 MED ORDER — ACETAMINOPHEN 500 MG PO TABS
1000.0000 mg | ORAL_TABLET | Freq: Four times a day (QID) | ORAL | Status: AC
  Administered 2019-03-13 – 2019-03-14 (×4): 1000 mg via ORAL
  Filled 2019-03-13 (×4): qty 2

## 2019-03-13 MED ORDER — CALCIUM CARBONATE ANTACID 500 MG PO CHEW: 1.0000 | CHEWABLE_TABLET | Freq: Every day | ORAL | Status: DC | PRN

## 2019-03-13 MED ORDER — BUPIVACAINE HCL (PF) 0.5 % IJ SOLN
INTRAMUSCULAR | Status: AC
  Filled 2019-03-13: qty 30

## 2019-03-13 MED ORDER — REMIFENTANIL HCL 1 MG IV SOLR
INTRAVENOUS | Status: AC
  Filled 2019-03-13: qty 1000

## 2019-03-13 MED ORDER — PHENYLEPHRINE HCL (PRESSORS) 10 MG/ML IV SOLN
INTRAVENOUS | Status: DC | PRN
  Administered 2019-03-13 (×6): 100 ug via INTRAVENOUS

## 2019-03-13 MED ORDER — MIDAZOLAM HCL 2 MG/2ML IJ SOLN
INTRAMUSCULAR | Status: DC | PRN
  Administered 2019-03-13: 2 mg via INTRAVENOUS

## 2019-03-13 MED ORDER — PHENOL 1.4 % MT LIQD
1.0000 | OROMUCOSAL | Status: DC | PRN
  Filled 2019-03-13: qty 177

## 2019-03-13 MED ORDER — LORATADINE 10 MG PO TABS
10.0000 mg | ORAL_TABLET | Freq: Every day | ORAL | Status: DC
  Administered 2019-03-14: 10 mg via ORAL
  Filled 2019-03-13: qty 1

## 2019-03-13 MED ORDER — ONDANSETRON HCL 4 MG/2ML IJ SOLN: 4.0000 mg | Freq: Four times a day (QID) | INTRAMUSCULAR | Status: DC | PRN

## 2019-03-13 MED ORDER — REMIFENTANIL HCL 1 MG IV SOLR
INTRAVENOUS | Status: DC | PRN
  Administered 2019-03-13: .08 ug/kg/min via INTRAVENOUS

## 2019-03-13 MED ORDER — FAMOTIDINE 20 MG PO TABS
ORAL_TABLET | ORAL | Status: AC
  Filled 2019-03-13: qty 1

## 2019-03-13 MED ORDER — METHOCARBAMOL 1000 MG/10ML IJ SOLN
500.0000 mg | Freq: Four times a day (QID) | INTRAVENOUS | Status: DC
  Administered 2019-03-13: 500 mg via INTRAVENOUS
  Filled 2019-03-13 (×7): qty 5

## 2019-03-13 MED ORDER — ALPRAZOLAM 0.25 MG PO TABS: 0.2500 mg | ORAL_TABLET | Freq: Two times a day (BID) | ORAL | Status: DC | PRN

## 2019-03-13 MED ORDER — SODIUM CHLORIDE 0.9 % IV SOLN
INTRAVENOUS | Status: DC | PRN
  Administered 2019-03-13: 15 ug/min via INTRAVENOUS

## 2019-03-13 MED ORDER — BACITRACIN 50000 UNITS IM SOLR
INTRAMUSCULAR | Status: AC
  Filled 2019-03-13: qty 1

## 2019-03-13 MED ORDER — HYDROMORPHONE HCL 1 MG/ML IJ SOLN: 0.5000 mg | INTRAMUSCULAR | Status: DC | PRN

## 2019-03-13 MED ORDER — SENNA 8.6 MG PO TABS
1.0000 | ORAL_TABLET | Freq: Two times a day (BID) | ORAL | Status: DC
  Administered 2019-03-13 – 2019-03-14 (×2): 8.6 mg via ORAL
  Filled 2019-03-13 (×2): qty 1

## 2019-03-13 MED ORDER — LACTATED RINGERS IV SOLN
INTRAVENOUS | Status: DC
  Administered 2019-03-13: 10:00:00 via INTRAVENOUS

## 2019-03-13 MED ORDER — POLYETHYLENE GLYCOL 3350 17 G PO PACK: 17.0000 g | PACK | Freq: Every day | ORAL | Status: DC | PRN

## 2019-03-13 MED ORDER — SODIUM CHLORIDE 0.9% FLUSH
3.0000 mL | Freq: Two times a day (BID) | INTRAVENOUS | Status: DC
  Administered 2019-03-13 – 2019-03-14 (×2): 3 mL via INTRAVENOUS

## 2019-03-13 MED ORDER — ROCURONIUM BROMIDE 100 MG/10ML IV SOLN
INTRAVENOUS | Status: DC | PRN
  Administered 2019-03-13: 5 mg via INTRAVENOUS

## 2019-03-13 MED ORDER — FAMOTIDINE 20 MG PO TABS
20.0000 mg | ORAL_TABLET | Freq: Once | ORAL | Status: AC
  Administered 2019-03-13: 20 mg via ORAL

## 2019-03-13 MED ORDER — FENTANYL CITRATE (PF) 100 MCG/2ML IJ SOLN
25.0000 ug | INTRAMUSCULAR | Status: DC | PRN
  Administered 2019-03-13 (×4): 25 ug via INTRAVENOUS

## 2019-03-13 MED ORDER — SODIUM CHLORIDE 0.9 % IV SOLN: 250.0000 mL | INTRAVENOUS | Status: DC

## 2019-03-13 MED ORDER — CEFAZOLIN SODIUM-DEXTROSE 2-4 GM/100ML-% IV SOLN
INTRAVENOUS | Status: AC
  Filled 2019-03-13: qty 100

## 2019-03-13 MED ORDER — FLUTICASONE PROPIONATE 50 MCG/ACT NA SUSP
2.0000 | Freq: Every day | NASAL | Status: DC | PRN
  Filled 2019-03-13: qty 16

## 2019-03-13 MED ORDER — SUGAMMADEX SODIUM 200 MG/2ML IV SOLN
INTRAVENOUS | Status: DC | PRN
  Administered 2019-03-13: 123.4 mg via INTRAVENOUS

## 2019-03-13 MED ORDER — ONDANSETRON HCL 4 MG PO TABS: 4.0000 mg | ORAL_TABLET | Freq: Four times a day (QID) | ORAL | Status: DC | PRN

## 2019-03-13 MED ORDER — ATORVASTATIN CALCIUM 20 MG PO TABS
80.0000 mg | ORAL_TABLET | Freq: Every day | ORAL | Status: DC
  Administered 2019-03-13: 80 mg via ORAL
  Filled 2019-03-13: qty 4

## 2019-03-13 MED ORDER — ONDANSETRON HCL 4 MG/2ML IJ SOLN
4.0000 mg | Freq: Once | INTRAMUSCULAR | Status: AC | PRN
  Administered 2019-03-13: 4 mg via INTRAVENOUS

## 2019-03-13 MED ORDER — THROMBIN 5000 UNITS EX SOLR
CUTANEOUS | Status: AC
  Filled 2019-03-13: qty 5000

## 2019-03-13 MED ORDER — FENTANYL CITRATE (PF) 250 MCG/5ML IJ SOLN
INTRAMUSCULAR | Status: AC
  Filled 2019-03-13: qty 5

## 2019-03-13 MED ORDER — SODIUM CHLORIDE 0.9 % IR SOLN
Status: DC | PRN
  Administered 2019-03-13: 1000 mL

## 2019-03-13 MED ORDER — FENTANYL CITRATE (PF) 100 MCG/2ML IJ SOLN
INTRAMUSCULAR | Status: AC
  Administered 2019-03-13: 25 ug via INTRAVENOUS
  Filled 2019-03-13: qty 2

## 2019-03-13 MED ORDER — DEXAMETHASONE SODIUM PHOSPHATE 10 MG/ML IJ SOLN
INTRAMUSCULAR | Status: DC | PRN
  Administered 2019-03-13: 10 mg via INTRAVENOUS

## 2019-03-13 MED ORDER — BISACODYL 5 MG PO TBEC: 5.0000 mg | DELAYED_RELEASE_TABLET | Freq: Every day | ORAL | Status: DC | PRN

## 2019-03-13 MED ORDER — BUPIVACAINE-EPINEPHRINE (PF) 0.5% -1:200000 IJ SOLN
INTRAMUSCULAR | Status: DC | PRN
  Administered 2019-03-13: 3 mL

## 2019-03-13 MED ORDER — SODIUM CHLORIDE 0.9% FLUSH: 3.0000 mL | INTRAVENOUS | Status: DC | PRN

## 2019-03-13 MED ORDER — METHOCARBAMOL 500 MG PO TABS
500.0000 mg | ORAL_TABLET | Freq: Four times a day (QID) | ORAL | Status: DC
  Administered 2019-03-13 – 2019-03-14 (×3): 500 mg via ORAL
  Filled 2019-03-13 (×4): qty 1

## 2019-03-13 MED ORDER — ACETAMINOPHEN 325 MG PO TABS: 650.0000 mg | ORAL_TABLET | ORAL | Status: DC | PRN

## 2019-03-13 MED ORDER — OXYCODONE HCL 5 MG PO TABS: 10.0000 mg | ORAL_TABLET | ORAL | Status: DC | PRN

## 2019-03-13 MED ORDER — THROMBIN 5000 UNITS EX SOLR
CUTANEOUS | Status: DC | PRN
  Administered 2019-03-13: 5000 [IU] via TOPICAL

## 2019-03-13 MED ORDER — PROPOFOL 10 MG/ML IV BOLUS
INTRAVENOUS | Status: DC | PRN
  Administered 2019-03-13: 100 mg via INTRAVENOUS

## 2019-03-13 MED ORDER — MENTHOL 3 MG MT LOZG
1.0000 | LOZENGE | OROMUCOSAL | Status: DC | PRN
  Filled 2019-03-13: qty 9

## 2019-03-13 MED ORDER — SODIUM CHLORIDE FLUSH 0.9 % IV SOLN
INTRAVENOUS | Status: AC
  Filled 2019-03-13: qty 10

## 2019-03-13 MED ORDER — CEFAZOLIN SODIUM-DEXTROSE 2-4 GM/100ML-% IV SOLN
2.0000 g | Freq: Once | INTRAVENOUS | Status: AC
  Administered 2019-03-13: 2 g via INTRAVENOUS

## 2019-03-13 MED ORDER — MAGNESIUM CITRATE PO SOLN
1.0000 | Freq: Once | ORAL | Status: DC | PRN
  Filled 2019-03-13: qty 296

## 2019-03-13 MED ORDER — MIDAZOLAM HCL 2 MG/2ML IJ SOLN
INTRAMUSCULAR | Status: AC
  Filled 2019-03-13: qty 2

## 2019-03-13 MED ORDER — LIDOCAINE HCL (CARDIAC) PF 100 MG/5ML IV SOSY
PREFILLED_SYRINGE | INTRAVENOUS | Status: DC | PRN
  Administered 2019-03-13: 70 mg via INTRAVENOUS

## 2019-03-13 MED ORDER — OXYCODONE HCL 5 MG PO TABS
5.0000 mg | ORAL_TABLET | ORAL | Status: DC | PRN
  Administered 2019-03-13 – 2019-03-14 (×3): 5 mg via ORAL
  Filled 2019-03-13 (×3): qty 1

## 2019-03-13 MED ORDER — SODIUM CHLORIDE 0.9 % IV SOLN
INTRAVENOUS | Status: DC
  Administered 2019-03-13: 17:00:00 via INTRAVENOUS

## 2019-03-13 SURGICAL SUPPLY — 65 items
ADH SKN CLS APL DERMABOND .7 (GAUZE/BANDAGES/DRESSINGS) ×1
AGENT HMST MTR 8 SURGIFLO (HEMOSTASIS) ×1
APL PRP STRL LF DISP 70% ISPRP (MISCELLANEOUS) ×2
BASKET BONE COLLECTION (BASKET) IMPLANT
BULB RESERV EVAC DRAIN JP 100C (MISCELLANEOUS) IMPLANT
BUR NEURO DRILL SOFT 3.0X3.8M (BURR) ×2 IMPLANT
CANISTER SUCT 1200ML W/VALVE (MISCELLANEOUS) ×4 IMPLANT
CHLORAPREP W/TINT 26 (MISCELLANEOUS) ×4 IMPLANT
COUNTER NEEDLE 20/40 LG (NEEDLE) ×2 IMPLANT
COVER LIGHT HANDLE STERIS (MISCELLANEOUS) ×4 IMPLANT
COVER WAND RF STERILE (DRAPES) ×2 IMPLANT
CRADLE LAMINECT ARM (MISCELLANEOUS) ×2 IMPLANT
CUP MEDICINE 2OZ PLAST GRAD ST (MISCELLANEOUS) ×2 IMPLANT
DERMABOND ADVANCED (GAUZE/BANDAGES/DRESSINGS) ×1
DERMABOND ADVANCED .7 DNX12 (GAUZE/BANDAGES/DRESSINGS) ×1 IMPLANT
DRAIN CHANNEL JP 10F RND 20C F (MISCELLANEOUS) IMPLANT
DRAPE C-ARM 42X72 X-RAY (DRAPES) ×4 IMPLANT
DRAPE LAPAROTOMY 77X122 PED (DRAPES) ×2 IMPLANT
DRAPE MICROSCOPE SPINE 48X150 (DRAPES) ×2 IMPLANT
DRAPE POUCH INSTRU U-SHP 10X18 (DRAPES) ×2 IMPLANT
DRAPE SURG 17X11 SM STRL (DRAPES) ×8 IMPLANT
ELECT CAUTERY BLADE TIP 2.5 (TIP) ×2
ELECT REM PT RETURN 9FT ADLT (ELECTROSURGICAL) ×2
ELECTRODE CAUTERY BLDE TIP 2.5 (TIP) ×1 IMPLANT
ELECTRODE REM PT RTRN 9FT ADLT (ELECTROSURGICAL) ×1 IMPLANT
FEE INTRAOP MONITOR IMPULS NCS (MISCELLANEOUS) IMPLANT
FRAME EYE SHIELD (PROTECTIVE WEAR) ×4 IMPLANT
GLOVE BIOGEL PI IND STRL 7.0 (GLOVE) ×1 IMPLANT
GLOVE BIOGEL PI INDICATOR 7.0 (GLOVE) ×1
GLOVE SURG SYN 7.0 (GLOVE) ×4 IMPLANT
GLOVE SURG SYN 7.0 PF PI (GLOVE) ×2 IMPLANT
GLOVE SURG SYN 8.5  E (GLOVE) ×3
GLOVE SURG SYN 8.5 E (GLOVE) ×3 IMPLANT
GLOVE SURG SYN 8.5 PF PI (GLOVE) ×3 IMPLANT
GOWN SRG XL LVL 3 NONREINFORCE (GOWNS) ×1 IMPLANT
GOWN STRL NON-REIN TWL XL LVL3 (GOWNS) ×2
GOWN STRL REUS W/TWL MED LVL3 (GOWN DISPOSABLE) ×2 IMPLANT
GRADUATE 1200CC STRL 31836 (MISCELLANEOUS) ×2 IMPLANT
INTRAOP MONITOR FEE IMPULS NCS (MISCELLANEOUS)
INTRAOP MONITOR FEE IMPULSE (MISCELLANEOUS)
KIT TURNOVER KIT A (KITS) ×2 IMPLANT
MARKER SKIN DUAL TIP RULER LAB (MISCELLANEOUS) ×4 IMPLANT
NDL SAFETY ECLIPSE 18X1.5 (NEEDLE) ×1 IMPLANT
NEEDLE HYPO 18GX1.5 SHARP (NEEDLE) ×2
NEEDLE HYPO 22GX1.5 SAFETY (NEEDLE) ×2 IMPLANT
NS IRRIG 1000ML POUR BTL (IV SOLUTION) ×2 IMPLANT
PACK LAMINECTOMY NEURO (CUSTOM PROCEDURE TRAY) ×2 IMPLANT
PIN CASPAR 14 (PIN) ×1 IMPLANT
PIN CASPAR 14MM (PIN) ×2
PLATE ANT CERV XTEND 2 LV 38 (Plate) ×1 IMPLANT
PUTTY DBX 1CC (Putty) ×4 IMPLANT
PUTTY DBX 1CC DEPUY (Putty) IMPLANT
SCREW VAR 4.2 XD SELF DRILL 14 (Screw) ×1 IMPLANT
SPACER C HEDRON 12X14 7M 7D (Spacer) ×2 IMPLANT
SPOGE SURGIFLO 8M (HEMOSTASIS) ×1
SPONGE KITTNER 5P (MISCELLANEOUS) ×2 IMPLANT
SPONGE SURGIFLO 8M (HEMOSTASIS) ×1 IMPLANT
STAPLER SKIN PROX 35W (STAPLE) IMPLANT
SUT V-LOC 90 ABS DVC 3-0 CL (SUTURE) ×2 IMPLANT
SUT VIC AB 3-0 SH 8-18 (SUTURE) ×2 IMPLANT
SYR 30ML LL (SYRINGE) ×2 IMPLANT
TAPE CLOTH 3X10 WHT NS LF (GAUZE/BANDAGES/DRESSINGS) ×2 IMPLANT
TOWEL OR 17X26 4PK STRL BLUE (TOWEL DISPOSABLE) ×6 IMPLANT
TRAY FOLEY MTR SLVR 16FR STAT (SET/KITS/TRAYS/PACK) IMPLANT
TUBING CONNECTING 10 (TUBING) ×2 IMPLANT

## 2019-03-13 NOTE — H&P (Signed)
I have reviewed and confirmed my history and physical from 02/28/2019 with no additions or changes. Plan for ACDF C5-7.  Risks and benefits reviewed.  Heart sounds normal no MRG. Chest Clear to Auscultation Bilaterally.

## 2019-03-13 NOTE — Anesthesia Post-op Follow-up Note (Signed)
Anesthesia QCDR form completed.        

## 2019-03-13 NOTE — Progress Notes (Signed)
Pt ambulated to bathroom, stand by assist, patient tolerated well.

## 2019-03-13 NOTE — Transfer of Care (Signed)
Immediate Anesthesia Transfer of Care Note  Patient: Tabitha Knight  Procedure(s) Performed: ANTERIOR CERVICAL DECOMPRESSION/DISCECTOMY FUSION 2 LEVELS C5-7 (N/A )  Patient Location: PACU  Anesthesia Type:General  Level of Consciousness: awake and alert   Airway & Oxygen Therapy: Patient Spontanous Breathing and Patient connected to face mask oxygen  Post-op Assessment: Report given to RN and Post -op Vital signs reviewed and stable  Post vital signs: Reviewed and stable  Last Vitals:  Vitals Value Taken Time  BP 159/86 03/13/19 1249  Temp 35.8 C 03/13/19 1249  Pulse 88 03/13/19 1255  Resp 30 03/13/19 1255  SpO2 99 % 03/13/19 1255  Vitals shown include unvalidated device data.  Last Pain:  Vitals:   03/13/19 0933  TempSrc: Tympanic  PainSc: 0-No pain         Complications: No apparent anesthesia complications

## 2019-03-13 NOTE — Anesthesia Procedure Notes (Signed)
Procedure Name: Intubation Performed by: Fredderick Phenix, CRNA Pre-anesthesia Checklist: Patient identified, Emergency Drugs available, Suction available and Patient being monitored Patient Re-evaluated:Patient Re-evaluated prior to induction Oxygen Delivery Method: Circle system utilized Preoxygenation: Pre-oxygenation with 100% oxygen Induction Type: IV induction Ventilation: Mask ventilation without difficulty Laryngoscope Size: McGraph (elective intubation with mcgraph. cspine maintined stable during intubation) Grade View: Grade I Tube type: Oral Tube size: 7.0 mm Number of attempts: 1 Airway Equipment and Method: Stylet and Oral airway Placement Confirmation: ETT inserted through vocal cords under direct vision,  positive ETCO2 and breath sounds checked- equal and bilateral Secured at: 21 cm Tube secured with: Tape Dental Injury: Teeth and Oropharynx as per pre-operative assessment

## 2019-03-13 NOTE — Anesthesia Preprocedure Evaluation (Signed)
Anesthesia Evaluation  Patient identified by MRN, date of birth, ID band Patient awake    Reviewed: Allergy & Precautions, NPO status , Patient's Chart, lab work & pertinent test results  Airway Mallampati: III       Dental   Pulmonary Current Smoker,    Pulmonary exam normal        Cardiovascular negative cardio ROS Normal cardiovascular exam     Neuro/Psych  Neuromuscular disease negative psych ROS   GI/Hepatic negative GI ROS, Neg liver ROS,   Endo/Other  negative endocrine ROS  Renal/GU negative Renal ROS  negative genitourinary   Musculoskeletal  (+) Arthritis , Osteoarthritis,    Abdominal Normal abdominal exam  (+)   Peds negative pediatric ROS (+)  Hematology negative hematology ROS (+)   Anesthesia Other Findings Past Medical History: No date: Arthritis No date: HLD (hyperlipidemia) No date: Osteoporosis No date: Psoriasis No date: Sinus congestion  Reproductive/Obstetrics                             Anesthesia Physical Anesthesia Plan  ASA: II  Anesthesia Plan: General   Post-op Pain Management:    Induction: Intravenous  PONV Risk Score and Plan:   Airway Management Planned: Oral ETT  Additional Equipment:   Intra-op Plan:   Post-operative Plan: Extubation in OR  Informed Consent: I have reviewed the patients History and Physical, chart, labs and discussed the procedure including the risks, benefits and alternatives for the proposed anesthesia with the patient or authorized representative who has indicated his/her understanding and acceptance.     Dental advisory given  Plan Discussed with: CRNA and Surgeon  Anesthesia Plan Comments:         Anesthesia Quick Evaluation

## 2019-03-13 NOTE — Op Note (Signed)
Indications: Tabitha Knight is a 63 yo female who suffered cervical radiculopathy.  She tried and failed conservative management, so elected for surgical intervention  Findings: stenosis C5-6 and C6-7  Preoperative Diagnosis: Cervical radiculopathy Postoperative Diagnosis: same   EBL: 50 ml IVF: 600 ml Drains: none Disposition: Extubated and Stable to PACU Complications: none  No foley catheter was placed.   Preoperative Note:   Risks of surgery discussed include: infection, bleeding, stroke, coma, death, paralysis, CSF leak, nerve/spinal cord injury, numbness, tingling, weakness, complex regional pain syndrome, recurrent stenosis and/or disc herniation, vascular injury, development of instability, neck/back pain, need for further surgery, persistent symptoms, development of deformity, and the risks of anesthesia. The patient understood these risks and agreed to proceed.  Operative Note:   Procedure:  1) Anterior cervical diskectomy and fusion at C5/6 and C6/7 2) Anterior cervical instrumentation at C5 - 7 using Globus Xtend 3) Placement of biomechanical devices at C5/6 and C6/7  4) Use of operative microscope 5) Use of flouroscopy   Procedure: After obtaining informed consent, the patient taken to the operating room, placed in supine position, general anesthesia induced.  The patient had a small shoulder roll placed behind their shoulders.  The patient received preop antibiotics and IV Decadron.  The patient had a neck incision outlined, was prepped and draped in usual sterile fashion. The incision was injected with local anesthetic.   An incision was opened, dissection taken down medial to the carotid artery and jugular vein, lateral to the trachea and esophagus.  The prevertebral fascia identified and a localizing x-ray demonstrated the correct level.  The longus colli were dissected laterally, and self-retaining retractors placed to open the operative field. The microscope was then  brought into the field.  With this complete, distractor pins were placed in the vertebral bodies of C5 and C7. The distractor was placed, and the annuli at C5/6 and C6/7 were opened using a bovie.  Curettes and pituitary rongeurs used to remove the majority of disk, then the drill was used to remove the posterior osteophyte and begin the foraminotomies. The nerve hook was used to elevate the posterior longitudinal ligament, which was then removed with Kerrison rongeurs. The microblunt nerve hook could be passed out the foramen bilateral at each level.   Meticulous hemostasis obtained.  A biomechanical device (Globus Hedron 7 mm height x 14 mm width by 12 mm depth) was placed at C5/6. A second biomechanical device (Globus Hedron 7 mm height x 14 mm width by 12 mm depth) was placed at C6/7. Each device had been filled with allograft for aid in arthrodesis.  The caspar distractor was removed, and bone wax used for hemostasis. A 28 mm Globus Xtend plate was chosen.  Two screws placed in each vertebral body, respectively making sure the screws were behind the locking mechanism.  Final AP and lateral radiographs were taken.   With everything in good position, the wound was irrigated copiously with bacitracin-containing solution and meticulous hemostasis obtained.  Wound was closed in 2 layers using interrupted inverted 3-0 Vicryl sutures in the platysma and 3-0 monocryl on the dermis.  The wound was dressed with dermabond, the head of bed at 30 degrees, taken to recovery room in stable condition.  No new postop neurological deficits were identified.  Sponge and pattie counts were correct at the end of the procedure.     I performed the entire procedure with the assistance of Marin Olp PA as an Pensions consultant.  Meade Maw MD

## 2019-03-14 ENCOUNTER — Encounter: Payer: Self-pay | Admitting: Neurosurgery

## 2019-03-14 ENCOUNTER — Observation Stay: Payer: BC Managed Care – PPO

## 2019-03-14 DIAGNOSIS — M4802 Spinal stenosis, cervical region: Secondary | ICD-10-CM | POA: Diagnosis not present

## 2019-03-14 MED ORDER — METHOCARBAMOL 500 MG PO TABS: 500.0000 mg | ORAL_TABLET | Freq: Four times a day (QID) | ORAL | 0 refills | Status: DC | PRN

## 2019-03-14 MED ORDER — OXYCODONE HCL 5 MG PO TABS: 5.0000 mg | ORAL_TABLET | ORAL | 0 refills | Status: DC | PRN

## 2019-03-14 NOTE — Discharge Instructions (Signed)

## 2019-03-14 NOTE — Anesthesia Postprocedure Evaluation (Signed)
Anesthesia Post Note  Patient: Tabitha Knight  Procedure(s) Performed: ANTERIOR CERVICAL DECOMPRESSION/DISCECTOMY FUSION 2 LEVELS C5-7 (N/A )  Patient location during evaluation: PACU Anesthesia Type: General Level of consciousness: awake and alert and oriented Pain management: pain level controlled Vital Signs Assessment: post-procedure vital signs reviewed and stable Respiratory status: spontaneous breathing Cardiovascular status: blood pressure returned to baseline Anesthetic complications: no     Last Vitals:  Vitals:   03/14/19 0321 03/14/19 0806  BP: 138/76 (!) 156/78  Pulse: 83 75  Resp: 18 18  Temp: 36.6 C 37.2 C  SpO2: 97% 98%    Last Pain:  Vitals:   03/14/19 0806  TempSrc: Oral  PainSc:                  Burk Hoctor

## 2019-03-14 NOTE — Plan of Care (Signed)
Discharge instructions and prescriptions reviewed and given.  Belongings with patient.  No s/s of distress noted. Discharged via wheelchair.

## 2019-03-14 NOTE — Discharge Summary (Signed)
Procedure: C5-C7 ACDF Procedure date: 03/13/2019 Diagnosis: Cervical radiculopathy   History: Tabitha Knight is s/p C5-7 ACDF for cervical radiculopathy  POD 1: Patient is recovering well.  Ambulating, voiding, and eating without issue.  Pain currently rated 4/10 described as soreness to the posterior cervical spine.  Upper extremity symptoms that she was experiencing prior to surgery have improved.  Denies any new upper extremity pain/numbness/tingling/weakness.  POD0: Tolerated procedure well. Evaluated in post op recovery still disoriented from anesthesia.  Complains of posterior neck pain at this time.  Physical Exam: Vitals:   03/14/19 0321 03/14/19 0806  BP: 138/76 (!) 156/78  Pulse: 83 75  Resp: 18 18  Temp: 97.9 F (36.6 C) 98.9 F (37.2 C)  SpO2: 97% 98%    General: Alert and oriented, sitting upright in hospital bed Strength:5/5 throughout upper and lower extremities Sensation: intact and symmetric throughout upper and lower extremities Skin: Glue intact at incision site.  No significant swelling noted.  Data:  Recent Labs  Lab 03/08/19 1300  NA 139  K 3.4*  CL 102  CO2 26  BUN 5*  CREATININE 0.51  GLUCOSE 106*  CALCIUM 9.0   No results for input(s): AST, ALT, ALKPHOS in the last 168 hours.  Invalid input(s): TBILI   Recent Labs  Lab 03/08/19 1300  WBC 5.8  HGB 14.9  HCT 41.9  PLT 210   Recent Labs  Lab 03/08/19 1300  APTT 25  INR 1.0         Other tests/results:  EXAM: CERVICAL SPINE - 2-3 VIEW 03/13/2019  IMPRESSION: Status post surgical anterior fusion of C5-6 and C6-7.  Assessment/Plan:  Tabitha Knight is POD0 s/p C6-C7 ACDF. Patient is recovering well. She has ambulated, voided, and eaten without issue. Pain adequately controlled on current pain regimen. Will continue post op pain management with tylenol, muscle relaxer, and pain medication as needed. Discussed activity restrictions and wound care. She is scheduled to follow  up in clinic in approximately 2 weeks. Advised to contact office if any questions or concerns arise before then.   Marin Olp PA-C Department of Neurosurgery

## 2019-03-14 NOTE — Progress Notes (Signed)
Pt wheeled to car by staff member. Husband providing transportation.

## 2019-03-14 NOTE — Plan of Care (Signed)
Patient denies pain or discomfort.  Incision clean, dry, intact to neck.  Call bell in reach.  Will continue to assess.

## 2019-04-03 ENCOUNTER — Telehealth: Payer: Self-pay

## 2019-04-03 ENCOUNTER — Other Ambulatory Visit: Payer: Self-pay | Admitting: Certified Nurse Midwife

## 2019-04-03 DIAGNOSIS — F419 Anxiety disorder, unspecified: Secondary | ICD-10-CM

## 2019-04-03 MED ORDER — ALPRAZOLAM 0.25 MG PO TABS: 0.2500 mg | ORAL_TABLET | Freq: Two times a day (BID) | ORAL | 4 refills | Status: AC | PRN

## 2019-04-03 NOTE — Telephone Encounter (Signed)
error 

## 2019-04-03 NOTE — Telephone Encounter (Signed)
mychart message sent to patient

## 2019-04-03 NOTE — Progress Notes (Signed)
Order placed for refill on xanax x 1. Explained that will only refill x 1. Order placed for behavior health.   Philip Aspen ,CNM

## 2019-04-03 NOTE — Telephone Encounter (Signed)
Patient didn't realize her xanex prescription expired on Dec.4th. She's requesting a refill. Last seen by [facility]

## 2019-04-04 ENCOUNTER — Telehealth: Payer: Self-pay

## 2019-04-05 NOTE — Telephone Encounter (Signed)
error 

## 2019-12-18 ENCOUNTER — Other Ambulatory Visit: Payer: Self-pay | Admitting: Neurosurgery

## 2019-12-19 ENCOUNTER — Other Ambulatory Visit: Payer: Self-pay | Admitting: Neurosurgery

## 2019-12-19 DIAGNOSIS — R9389 Abnormal findings on diagnostic imaging of other specified body structures: Secondary | ICD-10-CM

## 2019-12-25 ENCOUNTER — Other Ambulatory Visit
Admission: RE | Admit: 2019-12-25 | Discharge: 2019-12-25 | Disposition: A | Discharge status: Home / Self Care | Payer: 59 | Source: Home / Self Care | Attending: Neurosurgery | Admitting: Neurosurgery

## 2019-12-25 ENCOUNTER — Other Ambulatory Visit: Payer: Self-pay

## 2019-12-25 ENCOUNTER — Ambulatory Visit
Admission: RE | Admit: 2019-12-25 | Discharge: 2019-12-25 | Disposition: A | Discharge status: Home / Self Care | Payer: 59 | Source: Ambulatory Visit | Attending: Neurosurgery | Admitting: Neurosurgery

## 2019-12-25 DIAGNOSIS — R9389 Abnormal findings on diagnostic imaging of other specified body structures: Secondary | ICD-10-CM | POA: Insufficient documentation

## 2019-12-25 DIAGNOSIS — R899 Unspecified abnormal finding in specimens from other organs, systems and tissues: Secondary | ICD-10-CM | POA: Insufficient documentation

## 2019-12-25 LAB — CREATININE, SERUM
Creatinine, Ser: 0.48 mg/dL (ref 0.44–1.00)
GFR calc Af Amer: >60 mL/min (ref >60)
GFR calc non Af Amer: >60 mL/min (ref >60)

## 2019-12-25 LAB — BUN: BUN: 8 mg/dL (ref 8–23)

## 2019-12-25 MED ORDER — IOHEXOL 300 MG/ML  SOLN: 75.0000 mL | Freq: Once | INTRAMUSCULAR | Status: DC | PRN

## 2019-12-25 MED ORDER — IOHEXOL 300 MG/ML  SOLN
100.0000 mL | Freq: Once | INTRAMUSCULAR | Status: AC | PRN
  Administered 2019-12-25: 75 mL via INTRAVENOUS

## 2019-12-26 ENCOUNTER — Encounter: Payer: Self-pay | Admitting: *Deleted

## 2019-12-26 DIAGNOSIS — R918 Other nonspecific abnormal finding of lung field: Secondary | ICD-10-CM

## 2019-12-26 NOTE — Progress Notes (Signed)
  Oncology Nurse Navigator Documentation  Navigator Location: [facility] (12/26/19 1300) Referral Date to RadOnc/MedOnc: 12/26/19 (12/26/19 1300) )Navigator Encounter Type: Introductory Phone Call (12/26/19 1300)   Abnormal Finding Date: 12/25/19 (12/26/19 1300)                   Treatment Phase: Abnormal Scans (12/26/19 1300) Barriers/Navigation Needs: Coordination of Care (12/26/19 1300)   Interventions: Coordination of Care (12/26/19 1300)   Coordination of Care: Appts;Radiology (12/26/19 1300)       phone call made to patient to review upcoming appts. All questions answered during call. Pt informed that will be scheduled for PET scan to further characterize lung nodule once insurance authorization received. Contact info given and instructed to call with any further questions or needs. Pt verbalized understanding. Nothing further needed at this time.           Time Spent with Patient: 30 (12/26/19 1300)

## 2019-12-27 ENCOUNTER — Encounter: Payer: Self-pay | Admitting: *Deleted

## 2019-12-27 ENCOUNTER — Telehealth: Payer: Self-pay | Admitting: Internal Medicine

## 2019-12-27 DIAGNOSIS — R911 Solitary pulmonary nodule: Secondary | ICD-10-CM

## 2019-12-27 NOTE — Telephone Encounter (Signed)
C- Please schedule pet scan; Melissa - need new patient apt.

## 2019-12-27 NOTE — Addendum Note (Signed)
Addended by: Telford Nab on: 12/27/2019 09:48 AM   Modules accepted: Orders

## 2019-12-27 NOTE — Telephone Encounter (Signed)
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 Rosie Fate nodule suspicious for malignancy.  Recommend PET scan ASAP.   Hayley - please order PET. Referral to Dr. Faith Rogue.  If feasible-add to tumor conference. appt-MD; next week.   GB

## 2019-12-27 NOTE — Telephone Encounter (Addendum)
PET scan ordered. Awaiting authorization from insurance before scheduling. Referral placed for Dr. Genevive Bi. Will add for tumor board discussion on 9/9.

## 2020-01-03 ENCOUNTER — Other Ambulatory Visit
Admission: RE | Admit: 2020-01-03 | Discharge: 2020-01-03 | Disposition: A | Discharge status: Home / Self Care | Payer: 59 | Source: Ambulatory Visit | Attending: Internal Medicine | Admitting: Internal Medicine

## 2020-01-03 ENCOUNTER — Encounter: Payer: Self-pay | Admitting: Internal Medicine

## 2020-01-03 ENCOUNTER — Other Ambulatory Visit: Payer: Self-pay

## 2020-01-03 ENCOUNTER — Inpatient Hospital Stay: Payer: 59 | Attending: Internal Medicine | Admitting: Internal Medicine

## 2020-01-03 ENCOUNTER — Other Ambulatory Visit: Payer: Self-pay | Admitting: *Deleted

## 2020-01-03 ENCOUNTER — Encounter: Payer: Self-pay | Admitting: *Deleted

## 2020-01-03 ENCOUNTER — Inpatient Hospital Stay: Payer: 59

## 2020-01-03 DIAGNOSIS — Z20822 Contact with and (suspected) exposure to covid-19: Secondary | ICD-10-CM | POA: Insufficient documentation

## 2020-01-03 DIAGNOSIS — R911 Solitary pulmonary nodule: Secondary | ICD-10-CM | POA: Insufficient documentation

## 2020-01-03 DIAGNOSIS — F17210 Nicotine dependence, cigarettes, uncomplicated: Secondary | ICD-10-CM | POA: Diagnosis not present

## 2020-01-03 DIAGNOSIS — Z01812 Encounter for preprocedural laboratory examination: Secondary | ICD-10-CM | POA: Insufficient documentation

## 2020-01-03 DIAGNOSIS — R918 Other nonspecific abnormal finding of lung field: Secondary | ICD-10-CM | POA: Insufficient documentation

## 2020-01-03 NOTE — Progress Notes (Signed)
  Oncology Nurse Navigator Documentation  Navigator Location: [facility] (01/03/20 1200)   )Navigator Encounter Type: Clinic/MDC (01/03/20 1200)               Multidisiplinary Clinic Date: 01/03/20 (01/03/20 1200) Multidisiplinary Clinic Type: Thoracic (01/03/20 1200)   Patient Visit Type: MedOnc (01/03/20 1200)   Barriers/Navigation Needs: Coordination of Care (01/03/20 1200)   Interventions: Coordination of Care (01/03/20 1200)   Coordination of Care: Appts;Radiology (01/03/20 1200)        Acuity: Level 2-Minimal Needs (1-2 Barriers Identified) (01/03/20 1200)      met with patient during initial medonc consultation with Dr. Rogue Bussing. All questions answered during visit. Reviewed upcoming appts. Contact info given and instructed to call with any further questions or needs.    Time Spent with Patient: 60 (01/03/20 1200)

## 2020-01-03 NOTE — Progress Notes (Signed)
[place] NOTE  Patient Care Team: Kirk Ruths, MD as PCP - General (Internal Medicine) Telford Nab, RN as Oncology Nurse Navigator Cammie Sickle, MD as Consulting Physician (Internal Medicine)  CHIEF COMPLAINTS/PURPOSE OF CONSULTATION: lung nodule/mass  #Left upper lobe nodule- 1.3 x 1.8 x 2.1 cm-spiculated abutting major fissure-highly concerning for primary lung cancer; 7 mm triangular subpleural nodule-question benign  #Smoking-10 years [quit August 2021]; neck surgery [November 2020 Dr. Izora Ribas Oncology History   No history exists.     HISTORY OF PRESENTING ILLNESS:  Tabitha Knight 64 y.o.  female history of smoking is here for further evaluation and recommendations for lung nodule.  Patient notes to have a neck surgery in November 2020 with Dr. Izora Ribas.  However on a routine postoperative x-ray patient noted to have a lung nodule.  This was followed by a CT scan as noted above.  She has been referred to [place] for further evaluation recommendations.  Patient denies any headaches but denies any nausea vomiting.  Denies any unusual shortness of breath or cough.    Review of Systems  Constitutional: Negative for chills, diaphoresis, fever, malaise/fatigue and weight loss.  HENT: Negative for nosebleeds and sore throat.   Eyes: Negative for double vision.  Respiratory: Negative for cough, hemoptysis, sputum production, shortness of breath and wheezing.   Cardiovascular: Negative for chest pain, palpitations, orthopnea and leg swelling.  Gastrointestinal: Negative for abdominal pain, blood in stool, constipation, diarrhea, heartburn, melena, nausea and vomiting.  Genitourinary: Negative for dysuria, frequency and urgency.  Musculoskeletal: Negative for back pain and joint pain.  Skin: Negative.  Negative for itching and rash.  Neurological: Negative for dizziness, tingling, focal weakness, weakness and headaches.   Endo/Heme/Allergies: Does not bruise/bleed easily.  Psychiatric/Behavioral: Negative for depression. The patient is not nervous/anxious and does not have insomnia.      MEDICAL HISTORY:  Past Medical History:  Diagnosis Date  . Arthritis   . HLD (hyperlipidemia)   . Osteoporosis   . Psoriasis   . Sinus congestion     SURGICAL HISTORY: Past Surgical History:  Procedure Laterality Date  . ANTERIOR CERVICAL DECOMP/DISCECTOMY FUSION N/A 03/13/2019   Procedure: ANTERIOR CERVICAL DECOMPRESSION/DISCECTOMY FUSION 2 LEVELS C5-7;  Surgeon: Meade Maw, MD;  Location: [facility] ORS;  Service: Neurosurgery;  Laterality: N/A;  . CHOLECYSTECTOMY    . EXPLORATORY LAPAROTOMY  1988   removed appendix  . GALLBLADDER SURGERY  1993    SOCIAL HISTORY: Social History   Socioeconomic History  . Marital status: Married    Spouse name: Not on file  . Number of children: Not on file  . Years of education: Not on file  . Highest education level: Not on file  Occupational History  . Not on file  Tobacco Use  . Smoking status: Current Every Day Smoker    Packs/day: 0.25    Types: Cigarettes  . Smokeless tobacco: Never Used  Vaping Use  . Vaping Use: Never used  Substance and Sexual Activity  . Alcohol use: Yes  . Drug use: No  . Sexual activity: Never  Other Topics Concern  . Not on file  Social History Narrative   Quit smoking- Dec 08, 2019- [started in 50s]; light beer/ wine daily; insurance retd. Lives in husband; lives in [place].    Social Determinants of Health   Financial Resource Strain:   . Difficulty of Paying Living Expenses: Not on file  Food Insecurity:   . Worried About [employer] of  Food in the Last Year: Not on file  . Ran Out of Food in the Last Year: Not on file  Transportation Needs:   . Lack of Transportation (Medical): Not on file  . Lack of Transportation (Non-Medical): Not on file  Physical Activity:   . Days of Exercise per Week: Not on file  .  Minutes of Exercise per Session: Not on file  Stress:   . Feeling of Stress : Not on file  Social Connections:   . Frequency of Communication with Friends and Family: Not on file  . Frequency of Social Gatherings with Friends and Family: Not on file  . Attends Religious Services: Not on file  . Active Member of Clubs or Organizations: Not on file  . Attends Archivist Meetings: Not on file  . Marital Status: Not on file  Intimate Partner Violence:   . Fear of Current or Ex-Partner: Not on file  . Emotionally Abused: Not on file  . Physically Abused: Not on file  . Sexually Abused: Not on file    FAMILY HISTORY: Family History  Problem Relation Age of Onset  . Breast cancer Paternal Aunt     ALLERGIES:  is allergic to meperidine hcl and propoxyphene hcl.  MEDICATIONS:  Current Outpatient Medications  Medication Sig Dispense Refill  . ALPRAZolam (XANAX) 0.25 MG tablet Take 1 tablet (0.25 mg total) by mouth 2 (two) times daily as needed. for anxiety 45 tablet 4  . aspirin EC 81 MG tablet Take 81 mg by mouth daily.    Marland Kitchen atorvastatin (LIPITOR) 80 MG tablet Take 80 mg by mouth daily at 6 PM.     . calcium carbonate (TUMS - DOSED IN MG ELEMENTAL CALCIUM) 500 MG chewable tablet Chew 1-2 tablets by mouth daily as needed for indigestion or heartburn.    . cetirizine (ZYRTEC) 10 MG tablet Take 10 mg by mouth daily.    . Cholecalciferol (VITAMIN D) 50 MCG (2000 UT) tablet Take 2,000 Units by mouth daily.    . clobetasol ointment (TEMOVATE) 3.32 % Apply 1 application topically 2 (two) times daily as needed (psoriasis).     . fluticasone (FLONASE) 50 MCG/ACT nasal spray Place 2 sprays into both nostrils daily as needed.     . montelukast (SINGULAIR) 10 MG tablet Take 10 mg by mouth at bedtime.    . Sodium Chloride-Sodium Bicarb (NETI POT SINUS WASH NA) Place 1 Dose into the nose daily as needed (congestion).    . Tetrahydrozoline HCl (VISINE OP) Place 1 drop into both eyes daily as  needed (watery eyes).     No current facility-administered medications for this visit.      Marland Kitchen  PHYSICAL EXAMINATION: ECOG PERFORMANCE STATUS: 0 - Asymptomatic  Vitals:   01/03/20 0857  BP: (!) 169/94  Pulse: 80  Resp: 16  Temp: (!) 97.4 F (36.3 C)  SpO2: 100%   Filed Weights   01/03/20 0857  Weight: 130 lb 12.8 oz (59.3 kg)    Physical Exam HENT:     Head: Normocephalic and atraumatic.     Mouth/Throat:     Pharynx: No oropharyngeal exudate.  Eyes:     Pupils: Pupils are equal, round, and reactive to light.  Cardiovascular:     Rate and Rhythm: Normal rate and regular rhythm.  Pulmonary:     Effort: No respiratory distress.     Breath sounds: No wheezing.  Abdominal:     General: Bowel sounds are normal. There is no  distension.     Palpations: Abdomen is soft. There is no mass.     Tenderness: There is no abdominal tenderness. There is no guarding or rebound.  Musculoskeletal:        General: No tenderness. Normal range of motion.     Cervical back: Normal range of motion and neck supple.  Skin:    General: Skin is warm.  Neurological:     Mental Status: She is alert and oriented to person, place, and time.  Psychiatric:        Mood and Affect: Affect normal.      LABORATORY DATA:  I have reviewed the data as listed Lab Results  Component Value Date   WBC 5.8 03/08/2019   HGB 14.9 03/08/2019   HCT 41.9 03/08/2019   MCV 88.8 03/08/2019   PLT 210 03/08/2019   Recent Labs    03/08/19 1300 12/25/19 1020  NA 139  --   K 3.4*  --   CL 102  --   CO2 26  --   GLUCOSE 106*  --   BUN 5* 8  CREATININE 0.51 0.48  CALCIUM 9.0  --   GFRNONAA >60 >60  GFRAA >60 >60    RADIOGRAPHIC STUDIES: I have personally reviewed the radiological images as listed and agreed with the findings in the report. CT CHEST W CONTRAST  Result Date: 12/25/2019 CLINICAL DATA:  Abnormal chest x-ray, smoking history EXAM: CT CHEST WITH CONTRAST TECHNIQUE: Multidetector CT  imaging of the chest was performed during intravenous contrast administration. CONTRAST:  42mL OMNIPAQUE IOHEXOL 300 MG/ML  SOLN COMPARISON:  None. FINDINGS: Cardiovascular: No significant coronary artery calcification. Cardiac size within normal limits. No pericardial effusion. Central pulmonary arteries are of normal caliber. Thoracic aorta is unremarkable. Mediastinum/Nodes: Thyroid gland extend substernally, but is otherwise unremarkable. No pathologic adenopathy within the thorax. Small hiatal hernia. Esophagus unremarkable. Lungs/Pleura: A microlobulated, spiculated mass is seen within the left upper lobe abutting and slightly retracting the major fissure measuring 1.3 x 1.8 x 2.1 cm in greatest dimension on axial image # 38 and coronal image # 84, highly suspicious for a primary bronchogenic neoplasm. Triangular subpleural 7 mm pulmonary nodule within the a right upper lobe, axial image # 77/3 is technically indeterminate, but likely benign. No other focal pulmonary nodules or infiltrates are identified. No pneumothorax or pleural effusion. Central airways are widely patent. Upper Abdomen: Status post cholecystectomy. Rim calcified lesion within the upper pole of the left kidney is not well characterized on this examination but may represent an involuted cortical cyst measuring 8 mm. Musculoskeletal: No lytic or blastic bone lesions are identified. IMPRESSION: 1. A 1.3 x 1.8 x 2.1 cm microlobulated, spiculated mass within the left upper lobe, highly suspicious for a primary bronchogenic neoplasm. Surgical consultation is advised. 2. Triangular subpleural 7 mm pulmonary nodule within the right upper lobe, technically indeterminate, but likely benign. 3. No pathologic adenopathy within the thorax. Electronically Signed   By: Fidela Salisbury MD   On: 12/25/2019 22:06    ASSESSMENT & PLAN:   Left upper lobe pulmonary nodule #Left upper lobe lung nodule ~2 cm in size spiculated; highly concerning for  malignancy.  7 mm right upper lobe peripheral triangular opacity-likely benign.  Await/recommend PET scan for further evaluation.  Also order PFTs.  #Discussed that if PET scan is highly concerning for malignancy-patient will be offered surgery.  Patient seems a good surgical candidate given lack of longstanding history of smoking/and without any major respiratory  illness.  #Also discussed with the patient that given the size of the nodule-clinically suggestive of early stage cancer.  Unlikely recommend any adjuvant radiation or chemotherapy.  Also discussed that radiation is offered as alternative to surgery; however surgery is modality of choice.  Thank you Dr.Yarbrough for allowing me to participate in the care of your pleasant patient. Please do not hesitate to contact me with questions or concerns in the interim.  Discussed with [facility].  # DISPOSITION: # PET ASAP # Follow up TBD- Dr.B  # I reviewed the blood work- with the patient in detail; also reviewed the imaging independently [as summarized above]; and with the patient in detail.    All questions were answered. The patient knows to call the clinic with any problems, questions or concerns.    Cammie Sickle, MD 01/03/2020 1:01 PM

## 2020-01-03 NOTE — Assessment & Plan Note (Addendum)
#  Left upper lobe lung nodule ~2 cm in size spiculated; highly concerning for malignancy.  7 mm right upper lobe peripheral triangular opacity-likely benign.  Await/recommend PET scan for further evaluation.  Also order PFTs.  #Discussed that if PET scan is highly concerning for malignancy-patient will be offered surgery.  Patient seems a good surgical candidate given lack of longstanding history of smoking/and without any major respiratory illness.  #Also discussed with the patient that given the size of the nodule-clinically suggestive of early stage cancer.  Unlikely recommend any adjuvant radiation or chemotherapy.  Also discussed that radiation is offered as alternative to surgery; however surgery is modality of choice.  Thank you Dr.Yarbrough for allowing me to participate in the care of your pleasant patient. Please do not hesitate to contact me with questions or concerns in the interim.  Discussed with [facility].  # DISPOSITION: # PET ASAP # Follow up TBD- Dr.B  # I reviewed the blood work- with the patient in detail; also reviewed the imaging independently [as summarized above]; and with the patient in detail.

## 2020-01-04 LAB — SARS CORONAVIRUS 2 (TAT 6-24 HRS): SARS Coronavirus 2: NEGATIVE

## 2020-01-06 ENCOUNTER — Ambulatory Visit (INDEPENDENT_AMBULATORY_CARE_PROVIDER_SITE_OTHER): Payer: 59 | Admitting: Cardiothoracic Surgery

## 2020-01-06 ENCOUNTER — Other Ambulatory Visit: Payer: Self-pay | Admitting: Cardiothoracic Surgery

## 2020-01-06 ENCOUNTER — Ambulatory Visit
Admission: RE | Admit: 2020-01-06 | Discharge: 2020-01-06 | Disposition: A | Discharge status: Home / Self Care | Payer: Self-pay | Source: Ambulatory Visit | Attending: Cardiothoracic Surgery | Admitting: Cardiothoracic Surgery

## 2020-01-06 ENCOUNTER — Other Ambulatory Visit: Payer: Self-pay

## 2020-01-06 ENCOUNTER — Telehealth: Payer: Self-pay

## 2020-01-06 ENCOUNTER — Ambulatory Visit: Payer: 59 | Attending: Internal Medicine

## 2020-01-06 ENCOUNTER — Encounter: Payer: Self-pay | Admitting: Cardiothoracic Surgery

## 2020-01-06 VITALS — BP 187/96 | HR 82 | Temp 98.2°F | Resp 14 | Ht 61.0 in | Wt 131.0 lb

## 2020-01-06 DIAGNOSIS — R911 Solitary pulmonary nodule: Secondary | ICD-10-CM | POA: Diagnosis not present

## 2020-01-06 DIAGNOSIS — R918 Other nonspecific abnormal finding of lung field: Secondary | ICD-10-CM | POA: Diagnosis not present

## 2020-01-06 MED ORDER — ALBUTEROL SULFATE (2.5 MG/3ML) 0.083% IN NEBU
2.5000 mg | INHALATION_SOLUTION | Freq: Once | RESPIRATORY_TRACT | Status: AC
  Administered 2020-01-06: 2.5 mg via RESPIRATORY_TRACT
  Filled 2020-01-06: qty 3

## 2020-01-06 NOTE — Telephone Encounter (Signed)
Requested chest xray images be put on a disc and disc was carried to [facility] radiology to be added in epic and to be read.

## 2020-01-06 NOTE — [facility] Notes (Signed)
Patient ID: Tabitha Knight, female   DOB: 23-Dec-1955, 64 y.o.   MRN: [id number]  Chief Complaint  Patient presents with  . New Patient (Initial Visit)    left upper lobe nodule    Referred By Dr. Alison Murray Monday Reason for Referral left upper lobe mass  HPI Location, Quality, Duration, Severity, Timing, Context, Modifying Factors, Associated Signs and Symptoms.  Tabitha Knight is a 64 y.o. female.  This patient is a 64 year old female who underwent a cervical discectomy and fusion back in November.  As part of her routine follow-up she was having a chest x-ray made and was to be discharged from her neurosurgical caregiver.  The chest x-ray showed a possible lesion in the left upper lobe and she was subsequently sent for a chest CT.  The chest CT showed a 2 cm spiculated lesion in the left upper lobe.  There is no evidence of distant metastatic disease.  Patient was then scheduled for pulmonary function test and a PET scan which have yet to be completed.  She did see Dr. Lynett Fish recommended surgical consultation.  The patient states that she walks about 3 miles per day.  She is able to go up and down steps and has no limitations.  She smoked for about the last 10 years half a pack of cigarettes a day.  She has no limitations regarding her pulmonary function test.  She has had no cough or hemoptysis.  She has had no weight loss.  She is recovered completely from her neck surgery without symptoms at this time.   Past Medical History:  Diagnosis Date  . Arthritis   . HLD (hyperlipidemia)   . Osteoporosis   . Psoriasis   . Sinus congestion     Past Surgical History:  Procedure Laterality Date  . ANTERIOR CERVICAL DECOMP/DISCECTOMY FUSION N/A 03/13/2019   Procedure: ANTERIOR CERVICAL DECOMPRESSION/DISCECTOMY FUSION 2 LEVELS C5-7;  Surgeon: Meade Maw, MD;  Location: [facility] ORS;  Service: Neurosurgery;  Laterality: N/A;  . CHOLECYSTECTOMY    . EXPLORATORY LAPAROTOMY  1988    removed appendix  . GALLBLADDER SURGERY  1993    Family History  Problem Relation Age of Onset  . Breast cancer Paternal Aunt   . Hypertension Mother   . Hyperlipidemia Mother   . Cirrhosis Mother   . Heart disease Father   . Diabetes Father   . Stroke Father     Social History Social History   Tobacco Use  . Smoking status: Current Every Day Smoker    Packs/day: 0.25    Types: Cigarettes  . Smokeless tobacco: Never Used  Vaping Use  . Vaping Use: Never used  Substance Use Topics  . Alcohol use: Yes  . Drug use: No    Allergies  Allergen Reactions  . Meperidine Hcl     Unknown reaction  . Propoxyphene Hcl     Unknown reaction    Current Outpatient Medications  Medication Sig Dispense Refill  . ALPRAZolam (XANAX) 0.25 MG tablet Take 1 tablet (0.25 mg total) by mouth 2 (two) times daily as needed. for anxiety 45 tablet 4  . aspirin EC 81 MG tablet Take 81 mg by mouth daily.    Marland Kitchen atorvastatin (LIPITOR) 80 MG tablet Take 80 mg by mouth daily at 6 PM.     . calcium carbonate (TUMS - DOSED IN MG ELEMENTAL CALCIUM) 500 MG chewable tablet Chew 1-2 tablets by mouth daily as needed for indigestion or heartburn.    Marland Kitchen  cetirizine (ZYRTEC) 10 MG tablet Take 10 mg by mouth daily.    . Cholecalciferol (VITAMIN D) 50 MCG (2000 UT) tablet Take 2,000 Units by mouth daily.    . clobetasol ointment (TEMOVATE) 3.41 % Apply 1 application topically 2 (two) times daily as needed (psoriasis).     . fluticasone (FLONASE) 50 MCG/ACT nasal spray Place 2 sprays into both nostrils daily as needed.     . montelukast (SINGULAIR) 10 MG tablet Take 10 mg by mouth at bedtime.    . Sodium Chloride-Sodium Bicarb (NETI POT SINUS WASH NA) Place 1 Dose into the nose daily as needed (congestion).    . Tetrahydrozoline HCl (VISINE OP) Place 1 drop into both eyes daily as needed (watery eyes).     No current facility-administered medications for this visit.      Review of Systems A complete review of  systems was asked and was negative except for the following positive findings none  Blood pressure (!) 187/96, pulse 82, temperature 98.2 F (36.8 C), temperature source Oral, resp. rate 14, height 5\' 1"  (1.549 m), weight 131 lb (59.4 kg), SpO2 97 %.  Physical Exam CONSTITUTIONAL:  Pleasant, well-developed, well-nourished, and in no acute distress. EYES: Pupils equal and reactive to light, Sclera non-icteric EARS, NOSE, MOUTH AND THROAT:  The oropharynx was clear.  Dentition is good repair.  Oral mucosa pink and moist. LYMPH NODES:  Lymph nodes in the neck and axillae were normal RESPIRATORY:  Lungs were clear.  Normal respiratory effort without pathologic use of accessory muscles of respiration CARDIOVASCULAR: Heart was regular without murmurs.  There were no carotid bruits. GI: The abdomen was soft, nontender, and nondistended. There were no palpable masses. There was no hepatosplenomegaly. There were normal bowel sounds in all quadrants. GU:  Rectal deferred.   MUSCULOSKELETAL:  Normal muscle strength and tone.  No clubbing or cyanosis.   SKIN:  There were no pathologic skin lesions.  There were no nodules on palpation. NEUROLOGIC:  Sensation is normal.  Cranial nerves are grossly intact. PSYCH:  Oriented to person, place and time.  Mood and affect are normal.  Data Reviewed CT scan  I have personally reviewed the patient's imaging, laboratory findings and medical records.    Assessment    I have independently reviewed the patient's CT scan.  There is a spiculated lesion in the posterior segment of the left upper lobe just above the fissure.  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  There is an additional small irregular area in the right middle lobe which appears benign.    Plan    I explained to the patient the indications and risks of thoracotomy with resection.  Risks of bleeding, infection, air leak and death were all reviewed.  We also discussed the  role of wedge resection.  We discussed the role of radiation therapy.  I told the patient I would like her to call me once she has her PFTs and PET scan complete so that we can discuss any further questions that she may have.  Her and her husband appeared to have all the questions answered today.  They have no additional questions.       Nestor Lewandowsky, MD 01/06/2020, 10:07 AM

## 2020-01-06 NOTE — H&P (View-Only) (Signed)
Patient ID: Tabitha Knight, female   DOB: [birth year], 64 y.o.   MRN: [id number]  Chief Complaint  Patient presents with  . New Patient (Initial Visit)    left upper lobe nodule    Referred By Dr. Alison Murray Monday Reason for Referral left upper lobe mass  HPI Location, Quality, Duration, Severity, Timing, Context, Modifying Factors, Associated Signs and Symptoms.  Tabitha Knight is a 64 y.o. female.  This patient is a 64 year old female who underwent a cervical discectomy and fusion back in November.  As part of her routine follow-up she was having a chest x-ray made and was to be discharged from her neurosurgical caregiver.  The chest x-ray showed a possible lesion in the left upper lobe and she was subsequently sent for a chest CT.  The chest CT showed a 2 cm spiculated lesion in the left upper lobe.  There is no evidence of distant metastatic disease.  Patient was then scheduled for pulmonary function test and a PET scan which have yet to be completed.  She did see Dr. Lynett Fish recommended surgical consultation.  The patient states that she walks about 3 miles per day.  She is able to go up and down steps and has no limitations.  She smoked for about the last 10 years half a pack of cigarettes a day.  She has no limitations regarding her pulmonary function test.  She has had no cough or hemoptysis.  She has had no weight loss.  She is recovered completely from her neck surgery without symptoms at this time.   Past Medical History:  Diagnosis Date  . Arthritis   . HLD (hyperlipidemia)   . Osteoporosis   . Psoriasis   . Sinus congestion     Past Surgical History:  Procedure Laterality Date  . ANTERIOR CERVICAL DECOMP/DISCECTOMY FUSION N/A 03/13/2019   Procedure: ANTERIOR CERVICAL DECOMPRESSION/DISCECTOMY FUSION 2 LEVELS C5-7;  Surgeon: Meade Maw, MD;  Location: [facility] ORS;  Service: Neurosurgery;  Laterality: N/A;  . CHOLECYSTECTOMY    . EXPLORATORY LAPAROTOMY  1988    removed appendix  . GALLBLADDER SURGERY  1993    Family History  Problem Relation Age of Onset  . Breast cancer Paternal Aunt   . Hypertension Mother   . Hyperlipidemia Mother   . Cirrhosis Mother   . Heart disease Father   . Diabetes Father   . Stroke Father     Social History Social History   Tobacco Use  . Smoking status: Current Every Day Smoker    Packs/day: 0.25    Types: Cigarettes  . Smokeless tobacco: Never Used  Vaping Use  . Vaping Use: Never used  Substance Use Topics  . Alcohol use: Yes  . Drug use: No    Allergies  Allergen Reactions  . Meperidine Hcl     Unknown reaction  . Propoxyphene Hcl     Unknown reaction    Current Outpatient Medications  Medication Sig Dispense Refill  . ALPRAZolam (XANAX) 0.25 MG tablet Take 1 tablet (0.25 mg total) by mouth 2 (two) times daily as needed. for anxiety 45 tablet 4  . aspirin EC 81 MG tablet Take 81 mg by mouth daily.    Marland Kitchen atorvastatin (LIPITOR) 80 MG tablet Take 80 mg by mouth daily at 6 PM.     . calcium carbonate (TUMS - DOSED IN MG ELEMENTAL CALCIUM) 500 MG chewable tablet Chew 1-2 tablets by mouth daily as needed for indigestion or heartburn.    Marland Kitchen  cetirizine (ZYRTEC) 10 MG tablet Take 10 mg by mouth daily.    . Cholecalciferol (VITAMIN D) 50 MCG (2000 UT) tablet Take 2,000 Units by mouth daily.    . clobetasol ointment (TEMOVATE) 5.73 % Apply 1 application topically 2 (two) times daily as needed (psoriasis).     . fluticasone (FLONASE) 50 MCG/ACT nasal spray Place 2 sprays into both nostrils daily as needed.     . montelukast (SINGULAIR) 10 MG tablet Take 10 mg by mouth at bedtime.    . Sodium Chloride-Sodium Bicarb (NETI POT SINUS WASH NA) Place 1 Dose into the nose daily as needed (congestion).    . Tetrahydrozoline HCl (VISINE OP) Place 1 drop into both eyes daily as needed (watery eyes).     No current facility-administered medications for this visit.      Review of Systems A complete review of  systems was asked and was negative except for the following positive findings none  Blood pressure (!) 187/96, pulse 82, temperature 98.2 F (36.8 C), temperature source Oral, resp. rate 14, height 5\' 1"  (1.549 m), weight 131 lb (59.4 kg), SpO2 97 %.  Physical Exam CONSTITUTIONAL:  Pleasant, well-developed, well-nourished, and in no acute distress. EYES: Pupils equal and reactive to light, Sclera non-icteric EARS, NOSE, MOUTH AND THROAT:  The oropharynx was clear.  Dentition is good repair.  Oral mucosa pink and moist. LYMPH NODES:  Lymph nodes in the neck and axillae were normal RESPIRATORY:  Lungs were clear.  Normal respiratory effort without pathologic use of accessory muscles of respiration CARDIOVASCULAR: Heart was regular without murmurs.  There were no carotid bruits. GI: The abdomen was soft, nontender, and nondistended. There were no palpable masses. There was no hepatosplenomegaly. There were normal bowel sounds in all quadrants. GU:  Rectal deferred.   MUSCULOSKELETAL:  Normal muscle strength and tone.  No clubbing or cyanosis.   SKIN:  There were no pathologic skin lesions.  There were no nodules on palpation. NEUROLOGIC:  Sensation is normal.  Cranial nerves are grossly intact. PSYCH:  Oriented to person, place and time.  Mood and affect are normal.  Data Reviewed CT scan  I have personally reviewed the patient's imaging, laboratory findings and medical records.    Assessment    I have independently reviewed the patient's CT scan.  There is a spiculated lesion in the posterior segment of the left upper lobe just above the fissure.  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  There is an additional small irregular area in the right middle lobe which appears benign.    Plan    I explained to the patient the indications and risks of thoracotomy with resection.  Risks of bleeding, infection, air leak and death were all reviewed.  We also discussed the  role of wedge resection.  We discussed the role of radiation therapy.  I told the patient I would like her to call me once she has her PFTs and PET scan complete so that we can discuss any further questions that she may have.  Her and her husband appeared to have all the questions answered today.  They have no additional questions.       Nestor Lewandowsky, MD 01/06/2020, 10:07 AM

## 2020-01-06 NOTE — Patient Instructions (Addendum)
Please continue with the Pulmonary Function test and PET scan. Please call Dr.Oaks when you have the PET scan and he can discuss the options with you.

## 2020-01-08 LAB — PULMONARY FUNCTION TEST ARMC ONLY
DL/VA % pred: 112 %
DL/VA: 4.82 ml/min/mmHg/L
DLCO unc % pred: 114 %
DLCO unc: 20.75 ml/min/mmHg
FEF 25-75 Post: 2.15 L/sec
FEF 25-75 Pre: 1.86 L/sec
FEF2575-%Change-Post: 15 %
FEF2575-%Pred-Post: 102 %
FEF2575-%Pred-Pre: 89 %
FEV1-%Change-Post: 3 %
FEV1-%Pred-Post: 92 %
FEV1-%Pred-Pre: 89 %
FEV1-Post: 2.05 L
FEV1-Pre: 1.99 L
FEV1FVC-%Change-Post: 4 %
FEV1FVC-%Pred-Pre: 102 %
FEV6-%Change-Post: -1 %
FEV6-%Pred-Post: 88 %
FEV6-%Pred-Pre: 89 %
FEV6-Post: 2.46 L
FEV6-Pre: 2.49 L
FEV6FVC-%Pred-Post: 103 %
FEV6FVC-%Pred-Pre: 103 %
FVC-%Change-Post: -1 %
FVC-%Pred-Post: 85 %
FVC-%Pred-Pre: 86 %
FVC-Post: 2.46 L
FVC-Pre: 2.5 L
Post FEV1/FVC ratio: 83 %
Post FEV6/FVC ratio: 100 %
Pre FEV1/FVC ratio: 80 %
Pre FEV6/FVC Ratio: 100 %
RV % pred: 86 %
RV: 1.62 L
TLC % pred: 89 %
TLC: 4.13 L

## 2020-01-09 ENCOUNTER — Ambulatory Visit
Admission: RE | Admit: 2020-01-09 | Discharge: 2020-01-09 | Disposition: A | Discharge status: Home / Self Care | Payer: 59 | Source: Ambulatory Visit | Attending: Internal Medicine | Admitting: Internal Medicine

## 2020-01-09 ENCOUNTER — Other Ambulatory Visit: Payer: 59

## 2020-01-09 ENCOUNTER — Other Ambulatory Visit: Payer: Self-pay

## 2020-01-09 ENCOUNTER — Telehealth: Payer: Self-pay | Admitting: Cardiothoracic Surgery

## 2020-01-09 DIAGNOSIS — R918 Other nonspecific abnormal finding of lung field: Secondary | ICD-10-CM | POA: Insufficient documentation

## 2020-01-09 DIAGNOSIS — I700 Atherosclerosis of aorta: Secondary | ICD-10-CM | POA: Diagnosis not present

## 2020-01-09 LAB — GLUCOSE, CAPILLARY: Glucose-Capillary: 90 mg/dL (ref 70–99)

## 2020-01-09 MED ORDER — FLUDEOXYGLUCOSE F - 18 (FDG) INJECTION
6.8000 | Freq: Once | INTRAVENOUS | Status: AC | PRN
  Administered 2020-01-09: 7.443 via INTRAVENOUS

## 2020-01-09 NOTE — Progress Notes (Signed)
Tumor Board Documentation  Ruthanna Macchia Tabron was presented by Dr Rogue Bussing at our Tumor Board on 01/09/2020, which included representatives from medical oncology, radiation oncology, surgical oncology, internal medicine, navigation, pathology, radiology, surgical, pharmacy, genetics, research, pulmonology.  Tabitha Knight currently presents as a new patient, for discussion with history of the following treatments: active survellience.  Additionally, we reviewed previous medical and familial history, history of present illness, and recent lab results along with all available histopathologic and imaging studies. The tumor board considered available treatment options and made the following recommendations: Surgery (Already seen by Dr Genevive Bi)    The following procedures/referrals were also placed: No orders of the defined types were placed in this encounter.   Clinical Trial Status: not discussed   Staging used: Not Applicable  National site-specific guidelines   were discussed with respect to the case.  Tumor board is a meeting of clinicians from various specialty areas who evaluate and discuss patients for whom a multidisciplinary approach is being considered. Final determinations in the plan of care are those of the provider(s). The responsibility for follow up of recommendations given during tumor board is that of the provider.   Today's extended care, comprehensive team conference, Tayja was not present for the discussion and was not examined.   Multidisciplinary Tumor Board is a multidisciplinary case peer review process.  Decisions discussed in the Multidisciplinary Tumor Board reflect the opinions of the specialists present at the conference without having examined the patient.  Ultimately, treatment and diagnostic decisions rest with the primary provider(s) and the patient.

## 2020-01-09 NOTE — Telephone Encounter (Signed)
I have sent a message to Dr Genevive Bi about this.

## 2020-01-09 NOTE — Telephone Encounter (Signed)
Patient called just to let us know that she completed her PET scan today and wanted to make sure that Dr. Genevive Bi reviews results of PET scan as well.  Thank you.

## 2020-01-10 ENCOUNTER — Telehealth: Payer: Self-pay | Admitting: Emergency Medicine

## 2020-01-10 ENCOUNTER — Other Ambulatory Visit: Payer: Self-pay | Admitting: Cardiothoracic Surgery

## 2020-01-10 DIAGNOSIS — R918 Other nonspecific abnormal finding of lung field: Secondary | ICD-10-CM

## 2020-01-10 NOTE — Telephone Encounter (Signed)
Medical Clearance sent to Dr Ouida Sills.  P:[phone]

## 2020-01-13 ENCOUNTER — Telehealth: Payer: Self-pay | Admitting: *Deleted

## 2020-01-13 ENCOUNTER — Encounter: Payer: Self-pay | Admitting: *Deleted

## 2020-01-13 ENCOUNTER — Telehealth: Payer: Self-pay | Admitting: Internal Medicine

## 2020-01-13 NOTE — Telephone Encounter (Signed)
3 weeks out-Thanks GB

## 2020-01-13 NOTE — Progress Notes (Signed)
Received medical clearance from Dr Tonette Bihari office. [place] for patient to be off Aspirin 7 days prior to surgery. Patient cleared at Low risk for surgery.

## 2020-01-13 NOTE — Telephone Encounter (Signed)
Spoke to patient regarding the discussion of the tumor conference; recommend surgery.  Patient has been contacted by Dr. Faith Rogue.  Haley-please have the patient follow-up for surgery. Thanks GB

## 2020-01-13 NOTE — Telephone Encounter (Signed)
Per Dr.Pabon states it is OK for patient to have the [place] Booster injection prior to surgery. Patient is scheduled for surgery with Dr.Oaks on 01/20/20. Patient advise to give our office a call should anything arise. Patient verbalized understanding and has no issues nor concerns.

## 2020-01-13 NOTE — Telephone Encounter (Signed)
Patient called and is having surgery with Dr Genevive Bi on Monday 01/20/20 Pre-Operative Bronchoscoy/Thoracotomy/Lobectomy and she wants to know if it is okay to go ahead and get her pfizer booster shot before surgery or does she need to wait. Please call and advise

## 2020-01-13 NOTE — Telephone Encounter (Signed)
Pt scheduled for surgery with Dr. Genevive Bi on 9/27. When would you like to schedule follow up?

## 2020-01-14 ENCOUNTER — Telehealth: Payer: Self-pay | Admitting: Cardiothoracic Surgery

## 2020-01-14 NOTE — Telephone Encounter (Signed)
Pt has been advised of Pre-Admission date/time, COVID Testing date and Surgery date.  Surgery Date: 01/20/20 Preadmission Testing Date: 01/15/20 (phone 8a-1p) Covid Testing Date: 01/16/20 - patient advised to go to the [facility] ([place]) between 8a-1p   Patient has been made aware to call [phone], between 1-3:00pm the day before surgery, to find out what time to arrive for surgery.

## 2020-01-15 ENCOUNTER — Other Ambulatory Visit: Payer: Self-pay

## 2020-01-15 ENCOUNTER — Encounter
Admission: RE | Admit: 2020-01-15 | Discharge: 2020-01-15 | Disposition: A | Discharge status: Home / Self Care | Payer: 59 | Source: Ambulatory Visit | Attending: Cardiothoracic Surgery | Admitting: Cardiothoracic Surgery

## 2020-01-15 HISTORY — DX: Personal history of other diseases of the digestive system: Z87.19

## 2020-01-15 HISTORY — DX: Anxiety disorder, unspecified: F41.9

## 2020-01-15 NOTE — Patient Instructions (Signed)
Your procedure is scheduled on: 01/20/20 Report to [facility]. To find out your arrival time please call [phone] between 1PM - 3PM on 01/17/20.  Remember: Instructions that are not followed completely may result in serious medical risk, up to and including death, or upon the discretion of your surgeon and anesthesiologist your surgery may need to be rescheduled.     _X__ 1. Do not eat food after midnight the night before your procedure.                 No gum chewing or hard candies. You may drink clear liquids up to 2 hours                 before you are scheduled to arrive for your surgery- DO not drink clear                 liquids within 2 hours of the start of your surgery.                 Clear Liquids include:  water, apple juice without pulp, clear carbohydrate                 drink such as Clearfast or Gatorade, Black Coffee or Tea (Do not add                 anything to coffee or tea). Diabetics water only  __X__2.  On the morning of surgery brush your teeth with toothpaste and water, you                 may rinse your mouth with mouthwash if you wish.  Do not swallow any              toothpaste of mouthwash.     _X__ 3.  No Alcohol for 24 hours before or after surgery.   _X__ 4.  Do Not Smoke or use e-cigarettes For 24 Hours Prior to Your Surgery.                 Do not use any chewable tobacco products for at least 6 hours prior to                 surgery.  ____  5.  Bring all medications with you on the day of surgery if instructed.   __X__  6.  Notify your doctor if there is any change in your medical condition      (cold, fever, infections).     Do not wear jewelry, make-up, hairpins, clips or nail polish. Do not wear lotions, powders, or perfumes.  Do not shave 48 hours prior to surgery. Men may shave face and neck. Do not bring valuables to the hospital.    [facility] is not responsible for any belongings or  valuables.  Contacts, dentures/partials or body piercings may not be worn into surgery. Bring a case for your contacts, glasses or hearing aids, a denture cup will be supplied. Leave your suitcase in the car. After surgery it may be brought to your room. For patients admitted to the hospital, discharge time is determined by your treatment team.   Patients discharged the day of surgery will not be allowed to drive home.   Please read over the following fact sheets that you were given:   MRSA Information  __X__ Take these medicines the morning of surgery with A SIP OF WATER:  1. ALPRAZolam (XANAX) 0.25 MG tablet if needed  2. cetirizine (ZYRTEC) 10 MG tablet  3.   4.  5.  6.  ____ Fleet Enema (as directed)   __X__ Use CHG Soap/SAGE wipes as directed  ____ Use inhalers on the day of surgery  ____ Stop metformin/Janumet/Farxiga 2 days prior to surgery    ____ Take 1/2 of usual insulin dose the night before surgery. No insulin the morning          of surgery.   ____ Stop Blood Thinners Coumadin/Plavix/Xarelto/Pleta/Pradaxa/Eliquis/Effient/Aspirin  on   Or contact your Surgeon, Cardiologist or Medical Doctor regarding  ability to stop your blood thinners  __X__ Stop Anti-inflammatories 7 days before surgery such as Advil, Ibuprofen, Motrin,  BC or Goodies Powder, Naprosyn, Naproxen, Aleve, Aspirin    __X__ Stop all herbal supplements, fish oil or vitamin E until after surgery.    ____ Bring C-Pap to the hospital.       How to Use Chlorhexidine for Bathing Chlorhexidine gluconate (CHG) is a germ-killing (antiseptic) solution that is used to clean the skin. It can get rid of the bacteria that normally live on the skin and can keep them away for about 24 hours. To clean your skin with CHG, you may be given:  A CHG solution to use in the shower or as part of a sponge bath.  A prepackaged cloth that contains CHG. Cleaning your skin with CHG may help lower the risk for  infection:  While you are staying in the intensive care unit of the hospital.  If you have a vascular access, such as a central line, to provide short-term or long-term access to your veins.  If you have a catheter to drain urine from your bladder.  If you are on a ventilator. A ventilator is a machine that helps you breathe by moving air in and out of your lungs.  After surgery. What are the risks? Risks of using CHG include:  A skin reaction.  Hearing loss, if CHG gets in your ears.  Eye injury, if CHG gets in your eyes and is not rinsed out.  The CHG product catching fire. Make sure that you avoid smoking and flames after applying CHG to your skin. Do not use CHG:  If you have a chlorhexidine allergy or have previously reacted to chlorhexidine.  On babies younger than 20 months of age. How to use CHG solution  Use CHG only as told by your health care provider, and follow the instructions on the label.  Use the full amount of CHG as directed. Usually, this is one bottle. During a shower Follow these steps when using CHG solution during a shower (unless your health care provider gives you different instructions): 1. Start the shower. 2. Use your normal soap and shampoo to wash your face and hair. 3. Turn off the shower or move out of the shower stream. 4. Pour the CHG onto a clean washcloth. Do not use any type of brush or rough-edged sponge. 5. Starting at your neck, lather your body down to your toes. Make sure you follow these instructions: ? If you will be having surgery, pay special attention to the part of your body where you will be having surgery. Scrub this area for at least 1 minute. ? Do not use CHG on your head or face. If the solution gets into your ears or eyes, rinse them well with water. ? Avoid your genital area. ? Avoid any areas of skin  that have broken skin, cuts, or scrapes. ? Scrub your back and under your arms. Make sure to wash skin folds. 6. Let  the lather sit on your skin for 1-2 minutes or as long as told by your health care provider. 7. Thoroughly rinse your entire body in the shower. Make sure that all body creases and crevices are rinsed well. 8. Dry off with a clean towel. Do not put any substances on your body afterward--such as powder, lotion, or perfume--unless you are told to do so by your health care provider. Only use lotions that are recommended by the manufacturer. 9. Put on clean clothes or pajamas. 10. If it is the night before your surgery, sleep in clean sheets.  During a sponge bath Follow these steps when using CHG solution during a sponge bath (unless your health care provider gives you different instructions): 1. Use your normal soap and shampoo to wash your face and hair. 2. Pour the CHG onto a clean washcloth. 3. Starting at your neck, lather your body down to your toes. Make sure you follow these instructions: ? If you will be having surgery, pay special attention to the part of your body where you will be having surgery. Scrub this area for at least 1 minute. ? Do not use CHG on your head or face. If the solution gets into your ears or eyes, rinse them well with water. ? Avoid your genital area. ? Avoid any areas of skin that have broken skin, cuts, or scrapes. ? Scrub your back and under your arms. Make sure to wash skin folds. 4. Let the lather sit on your skin for 1-2 minutes or as long as told by your health care provider. 5. Using a different clean, wet washcloth, thoroughly rinse your entire body. Make sure that all body creases and crevices are rinsed well. 6. Dry off with a clean towel. Do not put any substances on your body afterward--such as powder, lotion, or perfume--unless you are told to do so by your health care provider. Only use lotions that are recommended by the manufacturer. 7. Put on clean clothes or pajamas. 8. If it is the night before your surgery, sleep in clean sheets. How to use CHG  prepackaged cloths  Only use CHG cloths as told by your health care provider, and follow the instructions on the label.  Use the CHG cloth on clean, dry skin.  Do not use the CHG cloth on your head or face unless your health care provider tells you to.  When washing with the CHG cloth: ? Avoid your genital area. ? Avoid any areas of skin that have broken skin, cuts, or scrapes. Before surgery Follow these steps when using a CHG cloth to clean before surgery (unless your health care provider gives you different instructions): 1. Using the CHG cloth, vigorously scrub the part of your body where you will be having surgery. Scrub using a back-and-forth motion for 3 minutes. The area on your body should be completely wet with CHG when you are done scrubbing. 2. Do not rinse. Discard the cloth and let the area air-dry. Do not put any substances on the area afterward, such as powder, lotion, or perfume. 3. Put on clean clothes or pajamas. 4. If it is the night before your surgery, sleep in clean sheets.  For general bathing Follow these steps when using CHG cloths for general bathing (unless your health care provider gives you different instructions). 1. Use a separate CHG cloth  for each area of your body. Make sure you wash between any folds of skin and between your fingers and toes. Wash your body in the following order, switching to a new cloth after each step: ? The front of your neck, shoulders, and chest. ? Both of your arms, under your arms, and your hands. ? Your stomach and groin area, avoiding the genitals. ? Your right leg and foot. ? Your left leg and foot. ? The back of your neck, your back, and your buttocks. 2. Do not rinse. Discard the cloth and let the area air-dry. Do not put any substances on your body afterward--such as powder, lotion, or perfume--unless you are told to do so by your health care provider. Only use lotions that are recommended by the manufacturer. 3. Put on  clean clothes or pajamas. Contact a health care provider if:  Your skin gets irritated after scrubbing.  You have questions about using your solution or cloth. Get help right away if:  Your eyes become very red or swollen.  Your eyes itch badly.  Your skin itches badly and is red or swollen.  Your hearing changes.  You have trouble seeing.  You have swelling or tingling in your mouth or throat.  You have trouble breathing.  You swallow any chlorhexidine. Summary  Chlorhexidine gluconate (CHG) is a germ-killing (antiseptic) solution that is used to clean the skin. Cleaning your skin with CHG may help to lower your risk for infection.  You may be given CHG to use for bathing. It may be in a bottle or in a prepackaged cloth to use on your skin. Carefully follow your health care provider's instructions and the instructions on the product label.  Do not use CHG if you have a chlorhexidine allergy.  Contact your health care provider if your skin gets irritated after scrubbing. This information is not intended to replace advice given to you by your health care provider. Make sure you discuss any questions you have with your health care provider. Document Revised: 06/28/2018 Document Reviewed: 03/09/2017 Elsevier Patient Education  [place].

## 2020-01-16 ENCOUNTER — Ambulatory Visit
Admission: RE | Admit: 2020-01-16 | Discharge: 2020-01-16 | Disposition: A | Discharge status: Home / Self Care | Payer: 59 | Source: Ambulatory Visit | Attending: Cardiothoracic Surgery | Admitting: Cardiothoracic Surgery

## 2020-01-16 ENCOUNTER — Other Ambulatory Visit: Payer: 59

## 2020-01-16 ENCOUNTER — Encounter
Admission: RE | Admit: 2020-01-16 | Discharge: 2020-01-16 | Disposition: A | Discharge status: Home / Self Care | Payer: 59 | Source: Ambulatory Visit | Attending: Internal Medicine | Admitting: Internal Medicine

## 2020-01-16 DIAGNOSIS — Z01818 Encounter for other preprocedural examination: Secondary | ICD-10-CM | POA: Insufficient documentation

## 2020-01-16 DIAGNOSIS — Z01810 Encounter for preprocedural cardiovascular examination: Secondary | ICD-10-CM

## 2020-01-16 DIAGNOSIS — Z20822 Contact with and (suspected) exposure to covid-19: Secondary | ICD-10-CM | POA: Insufficient documentation

## 2020-01-16 LAB — CBC WITH DIFFERENTIAL/PLATELET
Abs Immature Granulocytes: 0.07 10*3/uL (ref 0.00–0.07)
Basophils Absolute: 0 10*3/uL (ref 0.0–0.1)
Basophils Relative: 1 %
Eosinophils Absolute: 0.1 10*3/uL (ref 0.0–0.5)
Eosinophils Relative: 3 %
HCT: 39.6 % (ref 36.0–46.0)
Hemoglobin: 14 g/dL (ref 12.0–15.0)
Immature Granulocytes: 1 %
Lymphocytes Relative: 24 %
Lymphs Abs: 1.3 10*3/uL (ref 0.7–4.0)
MCH: 32.2 pg (ref 26.0–34.0)
MCHC: 35.4 g/dL (ref 30.0–36.0)
MCV: 91 fL (ref 80.0–100.0)
Monocytes Absolute: 0.7 10*3/uL (ref 0.1–1.0)
Monocytes Relative: 13 %
Neutro Abs: 3.3 10*3/uL (ref 1.7–7.7)
Neutrophils Relative %: 58 %
Platelets: 219 10*3/uL (ref 150–400)
RBC: 4.35 MIL/uL (ref 3.87–5.11)
RDW: 11.2 % — ABNORMAL LOW (ref 11.5–15.5)
WBC: 5.6 10*3/uL (ref 4.0–10.5)
nRBC: 0 % (ref 0.0–0.2)

## 2020-01-16 LAB — COMPREHENSIVE METABOLIC PANEL
ALT: 42 U/L (ref 0–44)
AST: 42 U/L — ABNORMAL HIGH (ref 15–41)
Albumin: 4.6 g/dL (ref 3.5–5.0)
Alkaline Phosphatase: 77 U/L (ref 38–126)
Anion gap: 9 (ref 5–15)
BUN: 7 mg/dL — ABNORMAL LOW (ref 8–23)
CO2: 29 mmol/L (ref 22–32)
Calcium: 9.1 mg/dL (ref 8.9–10.3)
Chloride: 102 mmol/L (ref 98–111)
Creatinine, Ser: 0.41 mg/dL — ABNORMAL LOW (ref 0.44–1.00)
GFR calc Af Amer: >60 mL/min (ref >60)
GFR calc non Af Amer: >60 mL/min (ref >60)
Glucose, Bld: 94 mg/dL (ref 70–99)
Potassium: 4 mmol/L (ref 3.5–5.1)
Sodium: 140 mmol/L (ref 135–145)
Total Bilirubin: 1 mg/dL (ref 0.3–1.2)
Total Protein: 7.4 g/dL (ref 6.5–8.1)

## 2020-01-16 LAB — PROTIME-INR
INR: 0.9 (ref 0.8–1.2)
Prothrombin Time: 12.1 seconds (ref 11.4–15.2)

## 2020-01-16 LAB — APTT: aPTT: 26 seconds (ref 24–36)

## 2020-01-16 LAB — SARS CORONAVIRUS 2 (TAT 6-24 HRS): SARS Coronavirus 2: NEGATIVE

## 2020-01-17 ENCOUNTER — Encounter: Payer: Self-pay | Admitting: Cardiothoracic Surgery

## 2020-01-20 ENCOUNTER — Ambulatory Visit: Admit: 2020-01-20 | Payer: 59 | Admitting: Internal Medicine

## 2020-01-20 ENCOUNTER — Inpatient Hospital Stay: Payer: 59 | Admitting: Anesthesiology

## 2020-01-20 ENCOUNTER — Inpatient Hospital Stay
Admission: RE | Admit: 2020-01-20 | Discharge: 2020-01-30 | DRG: 164 | Disposition: A | Discharge status: Home / Self Care | Payer: 59 | Attending: Cardiothoracic Surgery | Admitting: Cardiothoracic Surgery

## 2020-01-20 ENCOUNTER — Ambulatory Visit: Payer: 59 | Admitting: Cardiothoracic Surgery

## 2020-01-20 ENCOUNTER — Encounter: Payer: Self-pay | Admitting: Cardiothoracic Surgery

## 2020-01-20 ENCOUNTER — Encounter
Admission: RE | Disposition: A | Discharge status: Home / Self Care | Payer: Self-pay | Source: Home / Self Care | Attending: Cardiothoracic Surgery

## 2020-01-20 ENCOUNTER — Inpatient Hospital Stay: Payer: 59

## 2020-01-20 ENCOUNTER — Other Ambulatory Visit: Payer: Self-pay

## 2020-01-20 DIAGNOSIS — Y838 Other surgical procedures as the cause of abnormal reaction of the patient, or of later complication, without mention of misadventure at the time of the procedure: Secondary | ICD-10-CM | POA: Diagnosis not present

## 2020-01-20 DIAGNOSIS — M810 Age-related osteoporosis without current pathological fracture: Secondary | ICD-10-CM | POA: Diagnosis present

## 2020-01-20 DIAGNOSIS — Z20822 Contact with and (suspected) exposure to covid-19: Secondary | ICD-10-CM | POA: Diagnosis present

## 2020-01-20 DIAGNOSIS — Z833 Family history of diabetes mellitus: Secondary | ICD-10-CM

## 2020-01-20 DIAGNOSIS — C3412 Malignant neoplasm of upper lobe, left bronchus or lung: Secondary | ICD-10-CM | POA: Diagnosis present

## 2020-01-20 DIAGNOSIS — M1990 Unspecified osteoarthritis, unspecified site: Secondary | ICD-10-CM | POA: Diagnosis present

## 2020-01-20 DIAGNOSIS — F17200 Nicotine dependence, unspecified, uncomplicated: Secondary | ICD-10-CM | POA: Diagnosis present

## 2020-01-20 DIAGNOSIS — Z981 Arthrodesis status: Secondary | ICD-10-CM | POA: Diagnosis not present

## 2020-01-20 DIAGNOSIS — Z902 Acquired absence of lung [part of]: Secondary | ICD-10-CM

## 2020-01-20 DIAGNOSIS — Z823 Family history of stroke: Secondary | ICD-10-CM

## 2020-01-20 DIAGNOSIS — C3490 Malignant neoplasm of unspecified part of unspecified bronchus or lung: Secondary | ICD-10-CM | POA: Diagnosis present

## 2020-01-20 DIAGNOSIS — J95812 Postprocedural air leak: Secondary | ICD-10-CM | POA: Diagnosis not present

## 2020-01-20 DIAGNOSIS — J95811 Postprocedural pneumothorax: Secondary | ICD-10-CM | POA: Diagnosis not present

## 2020-01-20 DIAGNOSIS — Z83438 Family history of other disorder of lipoprotein metabolism and other lipidemia: Secondary | ICD-10-CM | POA: Diagnosis not present

## 2020-01-20 DIAGNOSIS — Z803 Family history of malignant neoplasm of breast: Secondary | ICD-10-CM

## 2020-01-20 DIAGNOSIS — Z8249 Family history of ischemic heart disease and other diseases of the circulatory system: Secondary | ICD-10-CM

## 2020-01-20 DIAGNOSIS — Z7982 Long term (current) use of aspirin: Secondary | ICD-10-CM | POA: Diagnosis not present

## 2020-01-20 DIAGNOSIS — Z9889 Other specified postprocedural states: Secondary | ICD-10-CM

## 2020-01-20 DIAGNOSIS — Z9689 Presence of other specified functional implants: Secondary | ICD-10-CM

## 2020-01-20 DIAGNOSIS — E785 Hyperlipidemia, unspecified: Secondary | ICD-10-CM | POA: Diagnosis present

## 2020-01-20 DIAGNOSIS — Z888 Allergy status to other drugs, medicaments and biological substances status: Secondary | ICD-10-CM

## 2020-01-20 DIAGNOSIS — R918 Other nonspecific abnormal finding of lung field: Secondary | ICD-10-CM

## 2020-01-20 DIAGNOSIS — J9811 Atelectasis: Secondary | ICD-10-CM | POA: Diagnosis not present

## 2020-01-20 DIAGNOSIS — L409 Psoriasis, unspecified: Secondary | ICD-10-CM | POA: Diagnosis present

## 2020-01-20 DIAGNOSIS — J939 Pneumothorax, unspecified: Secondary | ICD-10-CM

## 2020-01-20 DIAGNOSIS — J439 Emphysema, unspecified: Secondary | ICD-10-CM | POA: Diagnosis not present

## 2020-01-20 DIAGNOSIS — Z79899 Other long term (current) drug therapy: Secondary | ICD-10-CM

## 2020-01-20 DIAGNOSIS — Z09 Encounter for follow-up examination after completed treatment for conditions other than malignant neoplasm: Secondary | ICD-10-CM

## 2020-01-20 HISTORY — PX: THORACOTOMY/LOBECTOMY: SHX6116

## 2020-01-20 LAB — PREPARE RBC (CROSSMATCH)

## 2020-01-20 SURGERY — BRONCHOSCOPY
Anesthesia: General | Laterality: Left

## 2020-01-20 SURGERY — COLONOSCOPY
Anesthesia: General

## 2020-01-20 MED ORDER — GLYCOPYRROLATE 0.2 MG/ML IJ SOLN
INTRAMUSCULAR | Status: AC
  Filled 2020-01-20: qty 1

## 2020-01-20 MED ORDER — METHOCARBAMOL 1000 MG/10ML IJ SOLN
500.0000 mg | Freq: Four times a day (QID) | INTRAVENOUS | Status: DC | PRN
  Administered 2020-01-20: 500 mg via INTRAVENOUS
  Filled 2020-01-20: qty 5

## 2020-01-20 MED ORDER — ALBUTEROL SULFATE (2.5 MG/3ML) 0.083% IN NEBU
INHALATION_SOLUTION | RESPIRATORY_TRACT | Status: AC
  Administered 2020-01-20: 2.5 mg via RESPIRATORY_TRACT
  Filled 2020-01-20: qty 3

## 2020-01-20 MED ORDER — LACTATED RINGERS IV SOLN: INTRAVENOUS | Status: DC | PRN

## 2020-01-20 MED ORDER — FENTANYL CITRATE (PF) 100 MCG/2ML IJ SOLN
INTRAMUSCULAR | Status: DC | PRN
Start: 2020-01-20 — End: 2020-01-20
  Administered 2020-01-20 (×8): 25 ug via INTRAVENOUS

## 2020-01-20 MED ORDER — ONDANSETRON HCL 4 MG/2ML IJ SOLN: 4.0000 mg | Freq: Four times a day (QID) | INTRAMUSCULAR | Status: DC | PRN

## 2020-01-20 MED ORDER — ONDANSETRON HCL 4 MG/2ML IJ SOLN
INTRAMUSCULAR | Status: AC
  Filled 2020-01-20: qty 2

## 2020-01-20 MED ORDER — DEXAMETHASONE SODIUM PHOSPHATE 10 MG/ML IJ SOLN
INTRAMUSCULAR | Status: AC
  Filled 2020-01-20: qty 1

## 2020-01-20 MED ORDER — SODIUM CHLORIDE 0.9 % IV SOLN
INTRAVENOUS | Status: DC | PRN
  Administered 2020-01-20: 70 mL

## 2020-01-20 MED ORDER — BISACODYL 5 MG PO TBEC
10.0000 mg | DELAYED_RELEASE_TABLET | Freq: Every day | ORAL | Status: DC
  Administered 2020-01-20 – 2020-01-26 (×7): 10 mg via ORAL
  Filled 2020-01-20 (×8): qty 2

## 2020-01-20 MED ORDER — ATORVASTATIN CALCIUM 20 MG PO TABS
80.0000 mg | ORAL_TABLET | Freq: Every day | ORAL | Status: DC
  Administered 2020-01-21 – 2020-01-29 (×9): 80 mg via ORAL
  Filled 2020-01-20: qty 1
  Filled 2020-01-20 (×5): qty 4
  Filled 2020-01-20: qty 1
  Filled 2020-01-20 (×4): qty 4

## 2020-01-20 MED ORDER — CEFAZOLIN SODIUM-DEXTROSE 2-4 GM/100ML-% IV SOLN
INTRAVENOUS | Status: AC
  Filled 2020-01-20: qty 100

## 2020-01-20 MED ORDER — PHENYLEPHRINE HCL (PRESSORS) 10 MG/ML IV SOLN
INTRAVENOUS | Status: AC
  Filled 2020-01-20: qty 1

## 2020-01-20 MED ORDER — CHLORHEXIDINE GLUCONATE 0.12 % MT SOLN
OROMUCOSAL | Status: AC
  Administered 2020-01-20: 15 mL via OROMUCOSAL
  Filled 2020-01-20: qty 15

## 2020-01-20 MED ORDER — METHOCARBAMOL 500 MG PO TABS
500.0000 mg | ORAL_TABLET | Freq: Four times a day (QID) | ORAL | Status: DC
  Administered 2020-01-21 – 2020-01-25 (×16): 500 mg via ORAL
  Filled 2020-01-20 (×20): qty 1

## 2020-01-20 MED ORDER — CHLORHEXIDINE GLUCONATE CLOTH 2 % EX PADS
6.0000 | MEDICATED_PAD | Freq: Once | CUTANEOUS | Status: AC
  Administered 2020-01-20: 6 via TOPICAL

## 2020-01-20 MED ORDER — KCL IN DEXTROSE-NACL 20-5-0.45 MEQ/L-%-% IV SOLN
INTRAVENOUS | Status: DC
  Filled 2020-01-20 (×5): qty 1000

## 2020-01-20 MED ORDER — SODIUM CHLORIDE (PF) 0.9 % IJ SOLN
INTRAMUSCULAR | Status: AC
  Filled 2020-01-20: qty 50

## 2020-01-20 MED ORDER — BUPIVACAINE HCL (PF) 0.25 % IJ SOLN
INTRAMUSCULAR | Status: AC
  Filled 2020-01-20: qty 30

## 2020-01-20 MED ORDER — FENTANYL CITRATE (PF) 100 MCG/2ML IJ SOLN
INTRAMUSCULAR | Status: AC
  Filled 2020-01-20: qty 2

## 2020-01-20 MED ORDER — PROPOFOL 10 MG/ML IV BOLUS
INTRAVENOUS | Status: DC | PRN
  Administered 2020-01-20: 100 mg via INTRAVENOUS
  Administered 2020-01-20: 10 mg via INTRAVENOUS
  Administered 2020-01-20: 60 mg via INTRAVENOUS

## 2020-01-20 MED ORDER — CEFAZOLIN SODIUM-DEXTROSE 1-4 GM/50ML-% IV SOLN: 1.0000 g | Freq: Three times a day (TID) | INTRAVENOUS | Status: DC

## 2020-01-20 MED ORDER — OXYCODONE HCL 5 MG/5ML PO SOLN: 5.0000 mg | Freq: Once | ORAL | Status: DC | PRN

## 2020-01-20 MED ORDER — BUPIVACAINE LIPOSOME 1.3 % IJ SUSP
INTRAMUSCULAR | Status: AC
  Filled 2020-01-20: qty 20

## 2020-01-20 MED ORDER — CEFAZOLIN SODIUM-DEXTROSE 1-4 GM/50ML-% IV SOLN
INTRAVENOUS | Status: AC
  Administered 2020-01-20: 1 g via INTRAVENOUS
  Filled 2020-01-20: qty 50

## 2020-01-20 MED ORDER — SUGAMMADEX SODIUM 200 MG/2ML IV SOLN
INTRAVENOUS | Status: DC | PRN
  Administered 2020-01-20: 20 mg via INTRAVENOUS
  Administered 2020-01-20: 10 mg via INTRAVENOUS
  Administered 2020-01-20: 30 mg via INTRAVENOUS
  Administered 2020-01-20 (×3): 20 mg via INTRAVENOUS

## 2020-01-20 MED ORDER — CEFAZOLIN SODIUM-DEXTROSE 2-4 GM/100ML-% IV SOLN
2.0000 g | INTRAVENOUS | Status: AC
  Administered 2020-01-20: 2 g via INTRAVENOUS

## 2020-01-20 MED ORDER — CEFAZOLIN SODIUM 1 G IJ SOLR
INTRAMUSCULAR | Status: AC
  Filled 2020-01-20: qty 20

## 2020-01-20 MED ORDER — FAMOTIDINE 20 MG PO TABS: 20.0000 mg | ORAL_TABLET | Freq: Once | ORAL | Status: AC

## 2020-01-20 MED ORDER — CEFAZOLIN SODIUM-DEXTROSE 1-4 GM/50ML-% IV SOLN: 1.0000 g | Freq: Three times a day (TID) | INTRAVENOUS | Status: AC

## 2020-01-20 MED ORDER — PHENYLEPHRINE HCL (PRESSORS) 10 MG/ML IV SOLN
INTRAVENOUS | Status: DC | PRN
  Administered 2020-01-20: 50 ug via INTRAVENOUS
  Administered 2020-01-20: 150 ug via INTRAVENOUS
  Administered 2020-01-20: 50 ug via INTRAVENOUS
  Administered 2020-01-20: 200 ug via INTRAVENOUS
  Administered 2020-01-20: 100 ug via INTRAVENOUS
  Administered 2020-01-20: 50 ug via INTRAVENOUS

## 2020-01-20 MED ORDER — FIBRIN SEALANT 2 ML SINGLE DOSE KIT
PACK | CUTANEOUS | Status: DC | PRN
  Administered 2020-01-20: 2 mL via TOPICAL

## 2020-01-20 MED ORDER — LACTATED RINGERS IV SOLN: INTRAVENOUS | Status: DC

## 2020-01-20 MED ORDER — ONDANSETRON HCL 4 MG/2ML IJ SOLN
INTRAMUSCULAR | Status: DC | PRN
  Administered 2020-01-20: 4 mg via INTRAVENOUS

## 2020-01-20 MED ORDER — ALBUTEROL SULFATE (2.5 MG/3ML) 0.083% IN NEBU
2.5000 mg | INHALATION_SOLUTION | RESPIRATORY_TRACT | Status: DC
  Administered 2020-01-20 – 2020-01-24 (×13): 2.5 mg via RESPIRATORY_TRACT
  Filled 2020-01-20 (×12): qty 3

## 2020-01-20 MED ORDER — GLYCOPYRROLATE 0.2 MG/ML IJ SOLN
INTRAMUSCULAR | Status: DC | PRN
  Administered 2020-01-20: .2 mg via INTRAVENOUS

## 2020-01-20 MED ORDER — FAMOTIDINE 20 MG PO TABS
ORAL_TABLET | ORAL | Status: AC
  Administered 2020-01-20: 20 mg via ORAL
  Filled 2020-01-20: qty 1

## 2020-01-20 MED ORDER — CHLORHEXIDINE GLUCONATE 0.12 % MT SOLN: 15.0000 mL | Freq: Once | OROMUCOSAL | Status: AC

## 2020-01-20 MED ORDER — VITAMIN D3 25 MCG (1000 UNIT) PO TABS
2000.0000 [IU] | ORAL_TABLET | Freq: Every day | ORAL | Status: DC
  Administered 2020-01-21 – 2020-01-30 (×10): 2000 [IU] via ORAL
  Filled 2020-01-20 (×17): qty 2

## 2020-01-20 MED ORDER — OXYCODONE HCL 5 MG PO TABS: 5.0000 mg | ORAL_TABLET | Freq: Once | ORAL | Status: DC | PRN

## 2020-01-20 MED ORDER — OXYCODONE HCL 5 MG PO TABS
5.0000 mg | ORAL_TABLET | ORAL | Status: DC | PRN
  Administered 2020-01-20 – 2020-01-21 (×2): 5 mg via ORAL
  Administered 2020-01-21 – 2020-01-22 (×3): 10 mg via ORAL
  Administered 2020-01-23: 5 mg via ORAL
  Filled 2020-01-20: qty 2
  Filled 2020-01-20: qty 1
  Filled 2020-01-20: qty 2
  Filled 2020-01-20: qty 1
  Filled 2020-01-20: qty 2

## 2020-01-20 MED ORDER — ROCURONIUM BROMIDE 10 MG/ML (PF) SYRINGE
PREFILLED_SYRINGE | INTRAVENOUS | Status: AC
  Filled 2020-01-20: qty 10

## 2020-01-20 MED ORDER — DEXMEDETOMIDINE (PRECEDEX) IN NS 20 MCG/5ML (4 MCG/ML) IV SYRINGE
PREFILLED_SYRINGE | INTRAVENOUS | Status: AC
  Filled 2020-01-20: qty 5

## 2020-01-20 MED ORDER — LIDOCAINE HCL (CARDIAC) PF 100 MG/5ML IV SOSY
PREFILLED_SYRINGE | INTRAVENOUS | Status: DC | PRN
  Administered 2020-01-20: 80 mg via INTRAVENOUS

## 2020-01-20 MED ORDER — METHOCARBAMOL 500 MG PO TABS
ORAL_TABLET | ORAL | Status: AC
  Administered 2020-01-20: 500 mg via ORAL
  Filled 2020-01-20: qty 1

## 2020-01-20 MED ORDER — ACETAMINOPHEN 10 MG/ML IV SOLN
INTRAVENOUS | Status: DC | PRN
  Administered 2020-01-20: 1000 mg via INTRAVENOUS

## 2020-01-20 MED ORDER — OXYCODONE HCL 5 MG PO TABS
ORAL_TABLET | ORAL | Status: AC
Start: 2020-01-20 — End: 2020-01-20
  Administered 2020-01-20: 5 mg via ORAL
  Filled 2020-01-20: qty 1

## 2020-01-20 MED ORDER — DEXAMETHASONE SODIUM PHOSPHATE 10 MG/ML IJ SOLN
INTRAMUSCULAR | Status: DC | PRN
  Administered 2020-01-20: 10 mg via INTRAVENOUS

## 2020-01-20 MED ORDER — FENTANYL CITRATE (PF) 100 MCG/2ML IJ SOLN
25.0000 ug | INTRAMUSCULAR | Status: DC | PRN
  Administered 2020-01-20 (×5): 25 ug via INTRAVENOUS

## 2020-01-20 MED ORDER — DEXMEDETOMIDINE (PRECEDEX) IN NS 20 MCG/5ML (4 MCG/ML) IV SYRINGE
PREFILLED_SYRINGE | INTRAVENOUS | Status: DC | PRN
  Administered 2020-01-20: 8 ug via INTRAVENOUS
  Administered 2020-01-20 (×3): 4 ug via INTRAVENOUS

## 2020-01-20 MED ORDER — SODIUM CHLORIDE 0.9 % IV SOLN
INTRAVENOUS | Status: DC | PRN
  Administered 2020-01-20: 30 ug/min via INTRAVENOUS

## 2020-01-20 MED ORDER — MORPHINE SULFATE (PF) 2 MG/ML IV SOLN
1.0000 mg | INTRAVENOUS | Status: DC | PRN
  Administered 2020-01-21: 2 mg via INTRAVENOUS

## 2020-01-20 MED ORDER — LIDOCAINE HCL (PF) 2 % IJ SOLN
INTRAMUSCULAR | Status: AC
  Filled 2020-01-20: qty 5

## 2020-01-20 MED ORDER — OXYCODONE HCL 5 MG PO TABS
ORAL_TABLET | ORAL | Status: AC
  Administered 2020-01-22: 10 mg via ORAL
  Filled 2020-01-20: qty 1

## 2020-01-20 MED ORDER — ROCURONIUM BROMIDE 100 MG/10ML IV SOLN
INTRAVENOUS | Status: DC | PRN
  Administered 2020-01-20: 5 mg via INTRAVENOUS
  Administered 2020-01-20 (×2): 10 mg via INTRAVENOUS
  Administered 2020-01-20: 50 mg via INTRAVENOUS
  Administered 2020-01-20 (×4): 10 mg via INTRAVENOUS

## 2020-01-20 MED ORDER — MORPHINE SULFATE (PF) 4 MG/ML IV SOLN
INTRAVENOUS | Status: AC
  Administered 2020-01-20: 1 mg
  Administered 2020-01-20: 1 mg via INTRAVENOUS
  Filled 2020-01-20: qty 1

## 2020-01-20 MED ORDER — FENTANYL CITRATE (PF) 100 MCG/2ML IJ SOLN
INTRAMUSCULAR | Status: AC
  Administered 2020-01-20: 25 ug via INTRAVENOUS
  Filled 2020-01-20: qty 2

## 2020-01-20 MED ORDER — BUPIVACAINE HCL 0.25 % IJ SOLN
INTRAMUSCULAR | Status: DC | PRN
  Administered 2020-01-20: 30 mL

## 2020-01-20 MED ORDER — ALBUTEROL SULFATE (2.5 MG/3ML) 0.083% IN NEBU
INHALATION_SOLUTION | RESPIRATORY_TRACT | Status: AC
  Filled 2020-01-20: qty 3

## 2020-01-20 MED ORDER — CALCIUM CARBONATE ANTACID 500 MG PO CHEW
1.0000 | CHEWABLE_TABLET | Freq: Every day | ORAL | Status: DC | PRN
  Filled 2020-01-20: qty 2

## 2020-01-20 MED ORDER — ACETAMINOPHEN 10 MG/ML IV SOLN
INTRAVENOUS | Status: AC
  Filled 2020-01-20: qty 100

## 2020-01-20 MED ORDER — ORAL CARE MOUTH RINSE: 15.0000 mL | Freq: Once | OROMUCOSAL | Status: AC

## 2020-01-20 SURGICAL SUPPLY — 81 items
APL PRP STRL LF DISP 70% ISPRP (MISCELLANEOUS) ×2
BRONCHOSCOPE BFLEX 3.8 (MISCELLANEOUS) ×2 IMPLANT
CANISTER SUCT 1200ML W/VALVE (MISCELLANEOUS) ×2 IMPLANT
CATH  RT ANGL  28F  SOF (CATHETERS) ×2
CATH RT ANGL 28F SOF (CATHETERS) IMPLANT
CATH THOR STR 28F  SOFT WA (CATHETERS) ×2
CATH THOR STR 28F SOFT WA (CATHETERS) IMPLANT
CATH URET ROBINSON 16FR STRL (CATHETERS) ×1 IMPLANT
CHLORAPREP W/TINT 26 (MISCELLANEOUS) ×4 IMPLANT
CNTNR SPEC 2.5X3XGRAD LEK (MISCELLANEOUS) ×1
CONN REDUCER 3/8X3/8X3/8Y (CONNECTOR) ×2
CONNECTOR REDUCER 3/8X3/8 (MISCELLANEOUS) ×2 IMPLANT
CONNECTOR REDUCER 3/8X3/8X3/8Y (CONNECTOR) IMPLANT
CONT SPEC 4OZ STER OR WHT (MISCELLANEOUS) ×1
CONT SPEC 4OZ STRL OR WHT (MISCELLANEOUS) ×1
CONTAINER SPEC 2.5X3XGRAD LEK (MISCELLANEOUS) ×4 IMPLANT
CUTTER ECHEON FLEX ENDO 45 340 (ENDOMECHANICALS) ×1 IMPLANT
DECANTER SPIKE VIAL GLASS SM (MISCELLANEOUS) ×2 IMPLANT
DRAIN CHEST DRY SUCT SGL (MISCELLANEOUS) ×2 IMPLANT
DRAPE C-SECTION (MISCELLANEOUS) ×2 IMPLANT
DRAPE MAG INST 16X20 L/F (DRAPES) ×2 IMPLANT
DRSG OPSITE POSTOP 4X6 (GAUZE/BANDAGES/DRESSINGS) ×1 IMPLANT
DRSG OPSITE POSTOP 4X8 (GAUZE/BANDAGES/DRESSINGS) ×1 IMPLANT
ELECT BLADE 6.5 EXT (BLADE) ×3 IMPLANT
ELECT CAUTERY BLADE TIP 2.5 (TIP) ×2
ELECT REM PT RETURN 9FT ADLT (ELECTROSURGICAL) ×2
ELECTRODE CAUTERY BLDE TIP 2.5 (TIP) ×1 IMPLANT
ELECTRODE REM PT RTRN 9FT ADLT (ELECTROSURGICAL) ×1 IMPLANT
GAUZE SPONGE 4X4 12PLY STRL (GAUZE/BANDAGES/DRESSINGS) ×2 IMPLANT
GLOVE SURG SYN 7.5  E (GLOVE) ×24
GLOVE SURG SYN 7.5 E (GLOVE) ×12 IMPLANT
GLOVE SURG SYN 7.5 PF PI (GLOVE) ×2 IMPLANT
GOWN STRL REUS W/ TWL LRG LVL3 (GOWN DISPOSABLE) ×4 IMPLANT
GOWN STRL REUS W/TWL LRG LVL3 (GOWN DISPOSABLE) ×12
HEMOSTAT SURGICEL 2X3 (HEMOSTASIS) ×1 IMPLANT
KIT TURNOVER KIT A (KITS) ×2 IMPLANT
LABEL OR SOLS (LABEL) ×2 IMPLANT
LOOP RED MAXI  1X406MM (MISCELLANEOUS) ×1
LOOP VESSEL MAXI  1X406 RED (MISCELLANEOUS) ×1
LOOP VESSEL MAXI 1X406 RED (MISCELLANEOUS) ×1 IMPLANT
MARKER SKIN DUAL TIP RULER LAB (MISCELLANEOUS) ×2 IMPLANT
NDL SPNL 22GX3.5 QUINCKE BK (NEEDLE) ×1 IMPLANT
NEEDLE SPNL 22GX3.5 QUINCKE BK (NEEDLE) ×2 IMPLANT
PACK BASIN MAJOR ARMC (MISCELLANEOUS) ×2 IMPLANT
PENCIL ELECTRO HAND CTR (MISCELLANEOUS) ×1 IMPLANT
RELOAD PROXIMATE TA60MM GREEN (ENDOMECHANICALS) IMPLANT
RELOAD STAPLE 35X2.5 WHT THIN (STAPLE) ×4 IMPLANT
RELOAD STAPLE 45 GOLD REG/THCK (STAPLE) IMPLANT
RELOAD STAPLE 60 4.7 GRN THCK (ENDOMECHANICALS) IMPLANT
RELOAD STAPLER LINE PROX 30 GR (STAPLE) ×1 IMPLANT
SPONGE KITTNER 5P (MISCELLANEOUS) ×4 IMPLANT
STAPLE RELOAD 2.5MM WHITE (STAPLE) ×12 IMPLANT
STAPLE RELOAD 45MM GOLD (STAPLE) ×12 IMPLANT
STAPLER RELOAD LINE PROX 30 GR (STAPLE) ×2
STAPLER RELOADABLE 30 GRN THCK (STAPLE) ×1 IMPLANT
STAPLER SKIN PROX 35W (STAPLE) ×2 IMPLANT
STAPLER VASCULAR ECHELON 35 (CUTTER) ×1 IMPLANT
STRIP CLOSURE SKIN 1/2X4 (GAUZE/BANDAGES/DRESSINGS) ×2 IMPLANT
SUT CHROMIC 3 0 SH 27 (SUTURE) ×1 IMPLANT
SUT MNCRL 4-0 (SUTURE)
SUT MNCRL 4-0 27XMFL (SUTURE)
SUT PROLENE 5 0 RB 1 DA (SUTURE) IMPLANT
SUT SILK 0 (SUTURE) ×2
SUT SILK 0 30XBRD TIE 6 (SUTURE) ×1 IMPLANT
SUT SILK 1 SH (SUTURE) ×12 IMPLANT
SUT VIC AB 0 CT1 36 (SUTURE) ×4 IMPLANT
SUT VIC AB 2-0 CT1 27 (SUTURE) ×4
SUT VIC AB 2-0 CT1 TAPERPNT 27 (SUTURE) ×2 IMPLANT
SUT VICRYL 2 TP 1 (SUTURE) ×8 IMPLANT
SUTURE MNCRL 4-0 27XMF (SUTURE) IMPLANT
SYR 10ML SLIP (SYRINGE) IMPLANT
SYR 20ML LL LF (SYRINGE) ×4 IMPLANT
SYR BULB IRRIG 60ML STRL (SYRINGE) ×2 IMPLANT
TAPE CLOTH 3X10 WHT NS LF (GAUZE/BANDAGES/DRESSINGS) ×2 IMPLANT
TAPE TRANSPORE STRL 2 31045 (GAUZE/BANDAGES/DRESSINGS) IMPLANT
TRAY FOLEY MTR SLVR 16FR STAT (SET/KITS/TRAYS/PACK) ×2 IMPLANT
TROCAR FLEXIPATH 20X80 (ENDOMECHANICALS) IMPLANT
TROCAR FLEXIPATH THORACIC 15MM (ENDOMECHANICALS) IMPLANT
TUBING CONNECTING 10 (TUBING) ×2 IMPLANT
WATER STERILE IRR 1000ML POUR (IV SOLUTION) ×2 IMPLANT
YANKAUER SUCT BULB TIP FLEX NO (MISCELLANEOUS) ×2 IMPLANT

## 2020-01-20 NOTE — Plan of Care (Signed)
  Problem: Education: Goal: Knowledge of General Education information will improve Description: Including pain rating scale, medication(s)/side effects and non-pharmacologic comfort measures Outcome: Progressing   Problem: Clinical Measurements: Goal: Will remain free from infection Outcome: Progressing   Problem: Nutrition: Goal: Adequate nutrition will be maintained Outcome: Progressing   

## 2020-01-20 NOTE — Progress Notes (Signed)
I had the opportunity this morning to review with Tabitha Knight the indications and risks of left thoracotomy and left upper lobectomy.  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  I reviewed with her the risks of thoracotomy including risks of bleeding, infection, air leak and death.  I told her that we would attempt to resect the mass and get a frozen section diagnosis before proceeding with lobectomy.  I reviewed the postoperative care including an overnight stay in the intensive care unit as well as an in-hospital stay of 5 to 7 days.  All of her questions were answered.  She would like [place] to proceed.

## 2020-01-20 NOTE — Transfer of Care (Signed)
Immediate Anesthesia Transfer of Care Note  Patient: Tabitha Knight  Procedure(s) Performed: PRE-OPERATIVE BRONCHOSCOPY (Left ) THORACOTOMY/LOBECTOMY (Left )  Patient Location: PACU  Anesthesia Type:General  Level of Consciousness: awake, alert , oriented and patient cooperative  Airway & Oxygen Therapy: Patient Spontanous Breathing and Patient connected to face mask oxygen  Post-op Assessment: Report given to RN, Post -op Vital signs reviewed and stable and Patient moving all extremities  Post vital signs: Reviewed and stable  Last Vitals:  Vitals Value Taken Time  BP 88/56 01/20/20 1507  Temp    Pulse 60 01/20/20 1510  Resp 19 01/20/20 1510  SpO2 100 % 01/20/20 1510  Vitals shown include unvalidated device data.  Last Pain:  Vitals:   01/20/20 0823  TempSrc: Temporal  PainSc: 0-No pain         Complications: No complications documented.

## 2020-01-20 NOTE — Interval H&P Note (Signed)
History and Physical Interval Note:  01/20/2020 9:24 AM  Tabitha Knight  has presented today for surgery, with the diagnosis of Lung mass.  The various methods of treatment have been discussed with the patient and family. After consideration of risks, benefits and other options for treatment, the patient has consented to  Procedure(s): PRE-OPERATIVE BRONCHOSCOPY (Left) THORACOTOMY/LOBECTOMY (Left) as a surgical intervention.  The patient's history has been reviewed, patient examined, no change in status, stable for surgery.  I have reviewed the patient's chart and labs.  Questions were answered to the patient's satisfaction.     Nestor Lewandowsky

## 2020-01-20 NOTE — Anesthesia Preprocedure Evaluation (Signed)
Anesthesia Evaluation  Patient identified by MRN, date of birth, ID band Patient awake    Reviewed: Allergy & Precautions, H&P , NPO status , Patient's Chart, lab work & pertinent test results  History of Anesthesia Complications Negative for: history of anesthetic complications  Airway Mallampati: III  TM Distance: >3 FB Neck ROM: limited    Dental  (+) Chipped, Poor Dentition   Pulmonary neg shortness of breath, former smoker,    Pulmonary exam normal        Cardiovascular Exercise Tolerance: Good hypertension, (-) angina(-) Past MI and (-) DOE Normal cardiovascular exam     Neuro/Psych PSYCHIATRIC DISORDERS  Neuromuscular disease    GI/Hepatic Neg liver ROS, hiatal hernia, GERD  Medicated and Controlled,  Endo/Other  negative endocrine ROS  Renal/GU      Musculoskeletal   Abdominal   Peds  Hematology negative hematology ROS (+)   Anesthesia Other Findings Past Medical History: No date: Anxiety No date: Arthritis No date: History of hiatal hernia No date: HLD (hyperlipidemia) No date: Osteoporosis No date: Psoriasis No date: Sinus congestion  Past Surgical History: 03/13/2019: ANTERIOR CERVICAL DECOMP/DISCECTOMY FUSION; N/A     Comment:  Procedure: ANTERIOR CERVICAL DECOMPRESSION/DISCECTOMY               FUSION 2 LEVELS C5-7;  Surgeon: Meade Maw, MD;                Location: [facility] ORS;  Service: Neurosurgery;  Laterality:               N/A; No date: APPENDECTOMY No date: CHOLECYSTECTOMY 1988: EXPLORATORY LAPAROTOMY     Comment:  removed appendix 1993: GALLBLADDER SURGERY No date: NOVASURE ABLATION  BMI    Body Mass Index: 24.56 kg/m      Reproductive/Obstetrics negative OB ROS                             Anesthesia Physical Anesthesia Plan  ASA: III  Anesthesia Plan: General ETT   Post-op Pain Management:    Induction: Intravenous  PONV Risk Score and  Plan: Ondansetron, Dexamethasone, Midazolam and Treatment may vary due to age or medical condition  Airway Management Planned: Oral ETT and Double Lumen EBT  Additional Equipment:   Intra-op Plan:   Post-operative Plan: Extubation in OR  Informed Consent: I have reviewed the patients History and Physical, chart, labs and discussed the procedure including the risks, benefits and alternatives for the proposed anesthesia with the patient or authorized representative who has indicated his/her understanding and acceptance.     Dental Advisory Given  Plan Discussed with: Anesthesiologist, CRNA and Surgeon  Anesthesia Plan Comments: (Patient consented for risks of anesthesia including but not limited to:  - adverse reactions to medications - damage to eyes, teeth, lips or other oral mucosa - nerve damage due to positioning  - sore throat or hoarseness - Damage to heart, brain, nerves, lungs, other parts of body or loss of life  Patient voiced understanding.)        Anesthesia Quick Evaluation

## 2020-01-20 NOTE — Op Note (Signed)
01/20/2020  3:45 PM  PATIENT:  Tabitha Knight  64 y.o. 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  This was followed by a CT scan PET scan pulmonary function studies.  The CT scan and PET scan were mostly consistent with a left upper lobe bronchogenic carcinoma she was offered the above named surgery for definitive diagnosis and management  DICTATION: Patient was brought to the operating suite placed in the supine position.  General endotracheal anesthesia was given through a double-lumen tube.  A right-sided tube was placed and preoperative bronchoscopy was carried out.  The Murphy's I was positioned over the right upper lobe bronchus.  There was no evidence of endobronchial tumor.  The patient was then turned for left thoracotomy.  All pressure points were carefully padded.  A posterior lateral fifth interspace thoracotomy was performed.  The chest was entered.  The latissimus muscle was divided but the serratus was spared.  Upon entering the chest there is extensive adhesions to the upper and lower lobes which were taken down using electrocautery or sharp dissection.  Once this was complete we could palpate the lung and identified the left upper lobe mass which was within the fissure.  Using multiple firings of an endoscopic stapler we were able to completely excise the mass and sent this for frozen section.  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  We therefore turned our attention to a completion lobectomy.  The pulmonary artery was identified in the depths of the fissure and all the branches originating from the pulmonary artery to the left upper lobe were dissected.  The superior pulmonary vein was identified.  The inferior pulmonary ligament was divided up to the level of the inferior pulmonary vein.  Using a separate access incision inferiorly a vascular stapler was then used to transect all the branches of the pulmonary artery to the left upper lobe.  The branches of the pulmonary vein were taken in a similar fashion.  This was done to give [place] good exposure to the bronchus.  At that point the only remaining structure was the bronchus.  There is some filmy adhesions anteriorly between the upper and lower lobes and these were controlled with the stapling device.  The bronchus was then transected with a TX 30 stapler.  Prior to transecting the bronchus the lower lobe was ventilated and ventilated quite nicely.  The bronchial stump was then checked at 30 cm of water pressure and there is no evidence of an air leak.  We then placed 2 chest tubes.  An angled tube was positioned along the paravertebral space.  A anterior tube was positioned to the apex of the chest.  These were 28 [place] and they were connected to a Y connector into the Pleur-evac.  There was some small amount of bleeding along the bronchial staple line and this was secured with several interrupted 4-0 Prolene sutures.  Hemostasis at this point was complete  and the chest was then closed.  #2 Vicryl pericostal sutures were used to approximate the ribs.  The serratus muscle was attached to the chest wall with 0 Vicryl this the latissimus muscle was closed with #2 Vicryl and Scarpa's fascia was closed with 3-0 Vicryl.  The skin was closed with skin clips.  The chest tubes were secured with silk and sterile dressings were then applied.   The patient was then extubated and taken to the recovery room in stable condition.  All sponge needle and instrument counts were correct as reported to me at the end of the case.   Nestor Lewandowsky, MD

## 2020-01-20 NOTE — Anesthesia Procedure Notes (Signed)
Procedure Name: Intubation Date/Time: 01/20/2020 10:21 AM Performed by: Gayland Curry, CRNA Pre-anesthesia Checklist: Patient identified, Emergency Drugs available, Suction available and Patient being monitored Patient Re-evaluated:Patient Re-evaluated prior to induction Oxygen Delivery Method: Circle system utilized Preoxygenation: Pre-oxygenation with 100% oxygen Induction Type: IV induction Ventilation: Mask ventilation without difficulty and Oral airway inserted - appropriate to patient size Laryngoscope Size: McGraph and 3 Tube type: Oral Endobronchial tube: Right, Double lumen EBT, EBT position confirmed by auscultation and EBT position confirmed by fiberoptic bronchoscope and 35 Fr Number of attempts: 2 (bend of tip of ETT increased) Airway Equipment and Method: Stylet and Oral airway Placement Confirmation: ETT inserted through vocal cords under direct vision,  positive ETCO2 and breath sounds checked- equal and bilateral Secured at: 27 cm Tube secured with: Tape Dental Injury: Teeth and Oropharynx as per pre-operative assessment

## 2020-01-21 ENCOUNTER — Inpatient Hospital Stay: Payer: 59

## 2020-01-21 ENCOUNTER — Encounter: Payer: Self-pay | Admitting: Cardiothoracic Surgery

## 2020-01-21 LAB — TYPE AND SCREEN
ABO/RH(D): B NEG
Antibody Screen: NEGATIVE
Unit division: 0
Unit division: 0

## 2020-01-21 LAB — BASIC METABOLIC PANEL
Anion gap: 10 (ref 5–15)
BUN: <5 mg/dL — ABNORMAL LOW (ref 8–23)
CO2: 27 mmol/L (ref 22–32)
Calcium: 9 mg/dL (ref 8.9–10.3)
Chloride: 102 mmol/L (ref 98–111)
Creatinine, Ser: 0.36 mg/dL — ABNORMAL LOW (ref 0.44–1.00)
GFR calc Af Amer: >60 mL/min (ref >60)
GFR calc non Af Amer: >60 mL/min (ref >60)
Glucose, Bld: 134 mg/dL — ABNORMAL HIGH (ref 70–99)
Potassium: 3.7 mmol/L (ref 3.5–5.1)
Sodium: 139 mmol/L (ref 135–145)

## 2020-01-21 LAB — CBC
HCT: 35.5 % — ABNORMAL LOW (ref 36.0–46.0)
Hemoglobin: 13.3 g/dL (ref 12.0–15.0)
MCH: 32.4 pg (ref 26.0–34.0)
MCHC: 37.5 g/dL — ABNORMAL HIGH (ref 30.0–36.0)
MCV: 86.6 fL (ref 80.0–100.0)
Platelets: 210 10*3/uL (ref 150–400)
RBC: 4.1 MIL/uL (ref 3.87–5.11)
RDW: 11.4 % — ABNORMAL LOW (ref 11.5–15.5)
WBC: 13.2 10*3/uL — ABNORMAL HIGH (ref 4.0–10.5)
nRBC: 0 % (ref 0.0–0.2)

## 2020-01-21 LAB — PREPARE RBC (CROSSMATCH)

## 2020-01-21 LAB — BPAM RBC
Blood Product Expiration Date: 202110222359
Blood Product Expiration Date: 202110222359
Unit Type and Rh: 9500
Unit Type and Rh: 9500

## 2020-01-21 MED ORDER — CHLORHEXIDINE GLUCONATE CLOTH 2 % EX PADS
6.0000 | MEDICATED_PAD | Freq: Every day | CUTANEOUS | Status: DC
  Administered 2020-01-21: 6 via TOPICAL

## 2020-01-21 MED ORDER — OXYCODONE HCL 5 MG PO TABS
ORAL_TABLET | ORAL | Status: AC
  Administered 2020-01-21: 5 mg via ORAL
  Filled 2020-01-21: qty 1

## 2020-01-21 MED ORDER — CEFAZOLIN SODIUM-DEXTROSE 1-4 GM/50ML-% IV SOLN
INTRAVENOUS | Status: AC
  Administered 2020-01-21: 1 g via INTRAVENOUS
  Filled 2020-01-21: qty 50

## 2020-01-21 MED ORDER — OXYCODONE HCL 5 MG PO TABS
ORAL_TABLET | ORAL | Status: AC
  Administered 2020-01-21: 5 mg via ORAL
  Filled 2020-01-21: qty 2

## 2020-01-21 MED ORDER — OXYCODONE HCL 5 MG PO TABS
ORAL_TABLET | ORAL | Status: AC
  Filled 2020-01-21: qty 1

## 2020-01-21 MED ORDER — ALBUTEROL SULFATE (2.5 MG/3ML) 0.083% IN NEBU
INHALATION_SOLUTION | RESPIRATORY_TRACT | Status: AC
  Administered 2020-01-21: 2.5 mg via RESPIRATORY_TRACT
  Filled 2020-01-21: qty 3

## 2020-01-21 MED ORDER — ALPRAZOLAM 0.25 MG PO TABS
0.2500 mg | ORAL_TABLET | Freq: Two times a day (BID) | ORAL | Status: DC | PRN
  Administered 2020-01-21 – 2020-01-29 (×9): 0.25 mg via ORAL
  Filled 2020-01-21 (×9): qty 1

## 2020-01-21 MED ORDER — METHOCARBAMOL 500 MG PO TABS
ORAL_TABLET | ORAL | Status: AC
  Administered 2020-01-21: 500 mg via ORAL
  Filled 2020-01-21: qty 1

## 2020-01-21 NOTE — OR Nursing (Signed)
Informed Dr. Rosebud Poles of patient location and consult order

## 2020-01-21 NOTE — Progress Notes (Signed)
Patient arrived from PACU. VSS.

## 2020-01-21 NOTE — Anesthesia Postprocedure Evaluation (Addendum)
Anesthesia Post Note  Patient: Tabitha Knight  Procedure(s) Performed: PRE-OPERATIVE BRONCHOSCOPY (Left ) THORACOTOMY/LOBECTOMY (Left )  Patient location during evaluation: PACU Anesthesia Type: General Level of consciousness: awake and alert Pain management: pain level controlled Vital Signs Assessment: post-procedure vital signs reviewed and stable Respiratory status: spontaneous breathing, nonlabored ventilation, respiratory function stable and patient connected to nasal cannula oxygen Cardiovascular status: blood pressure returned to baseline and stable Postop Assessment: no apparent nausea or vomiting Anesthetic complications: no   No complications documented.   Last Vitals:  Vitals:   01/21/20 0600 01/21/20 0645  BP: (!) 114/57 (!) 127/58  Pulse: 92 92  Resp: 18 20  Temp: 36.4 C   SpO2: 99% 100%    Last Pain:  Vitals:   01/21/20 0645  TempSrc:   PainSc: 2                  Precious Haws Reylene Stauder

## 2020-01-21 NOTE — Evaluation (Signed)
Physical Therapy Evaluation Patient Details Name: Tabitha Knight MRN: [id number] DOB: 1956-01-20 Today's Date: 01/21/2020   History of Present Illness  64 y/o female s/p left thoracotomy with wedge resection left upper lobe mass and completion left upper lobectomy 2/2 incedential luing mass found with other testing.  Clinical Impression  Pt with some general hesitancy with all mobility secondary to L rib pain, etc but overall she was able to manage bed mobility and >100 ft of ambulation w/o AD (slow but safe cadence) and stable vitals.  Pt on room air t/o the effort with HR generally in the low 100s and O2 89-93% without overly labored breathing.  Overall she did well and should be able to return home and manage in the home safely. Pt will have husband home to assist 24/7 and overall she was quite active before.  She knows she will be a while before getting back to golfing and pickleball, but should be able to safely manage at home when medically cleared for d/c.     Follow Up Recommendations Follow surgeon's recommendation for DC plan and follow-up therapies (likely will not need f/u at home)    Equipment Recommendations  None recommended by PT    Recommendations for Other Services       Precautions / Restrictions Precautions Precautions: None Restrictions Weight Bearing Restrictions: No      Mobility  Bed Mobility Overal bed mobility: Modified Independent             General bed mobility comments: Pt did need light hand hold assist for something to pull on with R UE, pain hesitant but able to do vast majority of the transition  Transfers Overall transfer level: Modified independent Equipment used: None                Ambulation/Gait Ambulation/Gait assistance:  (pt with general hesitancy, but able to rise w/o assist) Gait Distance (Feet): 125 Feet Assistive device: None       General Gait Details: slow but steady ambulation.  HR remains in the 100s, O2 (on  room air) 89-93% the entire time.  Chest tube placed on walter seal during ambulation, returned to suction post ambulation  Stairs            Wheelchair Mobility    Modified Rankin (Stroke Patients Only)       Balance Overall balance assessment: Modified Independent (Pt feeling "off" from pain meds, but no LOBs or safety issue)                                           Pertinent Vitals/Pain Pain Assessment: 0-10 Pain Score: 3  Pain Location: L rib cage, tube insertion - increases with movement    Home Living Family/patient expects to be discharged to:: Private residence Living Arrangements: Spouse/significant other Available Help at Discharge: Family;Available 24 hours/day   Home Access: Level entry (small threshold)     Home Layout: One level Home Equipment: None (may have access to walker if needed)      Prior Function Level of Independence: Independent         Comments: Pt golfs and/or plays pickle ball weekly, able to be OOH and active     Hand Dominance        Extremity/Trunk Assessment   Upper Extremity Assessment Upper Extremity Assessment: Generalized weakness (more hesitant 2/2 rib pain, R WFL,  L pain limited 3-/5 t/o)    Lower Extremity Assessment Lower Extremity Assessment: Overall WFL for tasks assessed;Generalized weakness       Communication   Communication: No difficulties  Cognition Arousal/Alertness: Awake/alert Behavior During Therapy: WFL for tasks assessed/performed Overall Cognitive Status: Within Functional Limits for tasks assessed                                        General Comments General comments (skin integrity, edema, etc.): Pt somewhat anxious and guarded with activity, but overall did well and showed appropraite in-home mobitliy    Exercises     Assessment/Plan    PT Assessment Patient needs continued PT services  PT Problem List         PT Treatment Interventions DME  instruction;Gait training;Stair training;Functional mobility training;Therapeutic activities;Therapeutic exercise;Balance training;Patient/family education    PT Goals (Current goals can be found in the Care Plan section)  Acute Rehab PT Goals Patient Stated Goal: Go home PT Goal Formulation: With patient Time For Goal Achievement: 02/04/20 Potential to Achieve Goals: Good    Frequency Min 2X/week   Barriers to discharge        Co-evaluation               AM-PAC PT "6 Clicks" Mobility  Outcome Measure Help needed turning from your back to your side while in a flat bed without using bedrails?: None Help needed moving from lying on your back to sitting on the side of a flat bed without using bedrails?: A Little Help needed moving to and from a bed to a chair (including a wheelchair)?: None Help needed standing up from a chair using your arms (e.g., wheelchair or bedside chair)?: None Help needed to walk in hospital room?: None Help needed climbing 3-5 steps with a railing? : None 6 Click Score: 23    End of Session Equipment Utilized During Treatment: Gait belt Activity Tolerance: Patient tolerated treatment well Patient left: in bed;with call bell/phone within reach;with nursing/sitter in room Nurse Communication: Mobility status PT Visit Diagnosis: Muscle weakness (generalized) (M62.81);Difficulty in walking, not elsewhere classified (R26.2);Unsteadiness on feet (R26.81)    Time: 1103-1594 PT Time Calculation (min) (ACUTE ONLY): 34 min   Charges:   PT Evaluation $PT Eval Low Complexity: 1 Low PT Treatments $Gait Training: 8-22 mins        Kreg Shropshire, DPT 01/21/2020, 9:46 AM

## 2020-01-21 NOTE — Progress Notes (Signed)
Patient ID: Tabitha Knight, female   DOB: 1956/01/19, 64 y.o.   MRN: [id number]  She does have some postoperative discomfort but this is well controlled.  She is not short of breath.  She was able to eat yesterday and is hungry this morning.  Her wounds are clean and dry.  Her chest x-ray today shows a very small pneumothorax on the left and improving right upper lobe atelectasis.  There is a small air leak on the left.  There is minimal chest tube drainage and it is mostly serous to serosanguineous.  Her labs today are all acceptable.  We will transfer her to the floor today.  We will encourage ambulation.  We will encourage incentive spirometer breathing.

## 2020-01-21 NOTE — Progress Notes (Signed)
Patient requesting to take xanax tonight. She uses xanax at home PRN. Order received for Xanax from Dr Genevive Bi

## 2020-01-22 ENCOUNTER — Inpatient Hospital Stay: Payer: 59

## 2020-01-22 NOTE — Progress Notes (Signed)
Physical Therapy Treatment Patient Details Name: Tabitha Knight MRN: [id number] DOB: Mar 02, 1956 Today's Date: 01/22/2020    History of Present Illness 64 y/o female s/p left thoracotomy with wedge resection left upper lobe mass and completion left upper lobectomy 2/2 incedential luing mass found with other testing.    PT Comments    Pt was long sitting in bed upon arriving/ cleared by RN for participation. Discontinued suction form chest tube for ambulation and placed back once returned to room. Pt was able to ambulate 200 ft around RN station without LOB or difficulty. Slow cadence 2/2 to pain but overall tolerated well. HR raise to 108 bpm only with sao2 >94% throughout. Pt has supportive husband present throughout session and both pt/spouse feel that she will be able to safely manage at home once medically cleared by MD. Acute PT will continue to follow per POC and progress as able. At conclusion of session, pt was long sitting in bed with RN going to room to give medications.     Follow Up Recommendations  Follow surgeons recommendation for DC plan and follow-up therapies     Equipment Recommendations  None recommended by PT    Recommendations for Other Services       Precautions / Restrictions Precautions Precautions: None Restrictions Weight Bearing Restrictions: No    Mobility  Bed Mobility Overal bed mobility: Modified Independent             General bed mobility comments: HOB was elevated. increased time due to pain but no physical assistance required  Transfers Overall transfer level: Modified independent Equipment used: Rolling walker (2 wheeled);None             General transfer comment: pt initially stood to RW but was able to transition to no AD by the end of session.  Ambulation/Gait Ambulation/Gait assistance: Min guard;Supervision Gait Distance (Feet): 200 Feet Assistive device: None Gait Pattern/deviations: WFL(Within Functional  Limits) Gait velocity: decreased   General Gait Details: pt ambulate 200 ft with RW at first progressing to no AD by the end. HR elevated to 108 and o2 > 94% throughout. Slow gait kinematics 2/2 to incisional pain. RN aware         Cognition Arousal/Alertness: Awake/alert Behavior During Therapy: WFL for tasks assessed/performed Overall Cognitive Status: Within Functional Limits for tasks assessed  General Comments: Pt is A and O x 4. husband present and very supportive             Pertinent Vitals/Pain Pain Assessment: 0-10 Pain Score: 5  Pain Location: L rib cage, tube insertion - increases with movement Pain Descriptors / Indicators: Discomfort;Sore;Tender Pain Intervention(s): Limited activity within patient's tolerance;Monitored during session;Repositioned           PT Goals (current goals can now be found in the care plan section) Acute Rehab PT Goals Patient Stated Goal: Go home Progress towards PT goals: Progressing toward goals    Frequency    Min 2X/week      PT Plan Current plan remains appropriate    Co-evaluation              AM-PAC PT "6 Clicks" Mobility   Outcome Measure  Help needed turning from your back to your side while in a flat bed without using bedrails?: None Help needed moving from lying on your back to sitting on the side of a flat bed without using bedrails?: A Little Help needed moving to and from a bed to a chair (including a  wheelchair)?: None Help needed standing up from a chair using your arms (e.g., wheelchair or bedside chair)?: None Help needed to walk in hospital room?: None Help needed climbing 3-5 steps with a railing? : None 6 Click Score: 23    End of Session Equipment Utilized During Treatment:  (chest tube) Activity Tolerance: Patient tolerated treatment well Patient left: in bed;with call bell/phone within reach;with nursing/sitter in room Nurse Communication: Mobility status PT Visit Diagnosis: Muscle  weakness (generalized) (M62.81);Difficulty in walking, not elsewhere classified (R26.2);Unsteadiness on feet (R26.81)     Time: 8209-9068 PT Time Calculation (min) (ACUTE ONLY): 14 min  Charges:  $Gait Training: 8-22 mins                     Julaine Fusi PTA 01/22/20, 2:58 PM

## 2020-01-22 NOTE — Progress Notes (Signed)
Patient ID: Tabitha Knight, female   DOB: [birth year], 64 y.o.   MRN: [id number]  Some discomfort but well controlled.    Wounds with slight bruising but intact and no drainage.  Lungs normal on right with good air movement on left with mechanical sounds from tubes Heart regular  Small air leak   Will discontinue IV fluids and encourage ambulation Will repeat CXRay today  [employer]

## 2020-01-23 ENCOUNTER — Other Ambulatory Visit: Payer: Self-pay | Admitting: Anatomic Pathology & Clinical Pathology

## 2020-01-23 LAB — SURGICAL PATHOLOGY

## 2020-01-23 MED ORDER — ENSURE SURGERY PO LIQD
237.0000 mL | Freq: Two times a day (BID) | ORAL | Status: DC
  Administered 2020-01-23 – 2020-01-28 (×10): 237 mL via ORAL
  Filled 2020-01-23: qty 237

## 2020-01-23 MED ORDER — BISACODYL 10 MG RE SUPP
10.0000 mg | Freq: Every day | RECTAL | Status: DC | PRN
  Administered 2020-01-23: 10 mg via RECTAL
  Filled 2020-01-23: qty 1

## 2020-01-23 MED ORDER — MAGNESIUM HYDROXIDE 400 MG/5ML PO SUSP
30.0000 mL | Freq: Two times a day (BID) | ORAL | Status: DC | PRN
  Administered 2020-01-23: 30 mL via ORAL
  Filled 2020-01-23: qty 30

## 2020-01-23 MED ORDER — ACETAMINOPHEN 325 MG PO TABS
650.0000 mg | ORAL_TABLET | Freq: Four times a day (QID) | ORAL | Status: DC | PRN
  Administered 2020-01-23 – 2020-01-29 (×14): 650 mg via ORAL
  Filled 2020-01-23 (×15): qty 2

## 2020-01-23 NOTE — Progress Notes (Signed)
Patient ID: Tabitha Knight, female   DOB: [birth year], 64 y.o.   MRN: [id number]  Patient is postop day #3 today.  She says her pain is under better control.  She complains mostly of some soreness instead of actual pain.  She is not had a bowel movement has requested something for that.  Her vital signs are stable and she is afebrile.  Her wounds are clean dry and intact.  Her heart is regular.  Her lungs are equal bilaterally.  There is a small intermittent air leak.  Pathology is pending.  I explained to her that we would like her to ambulate in the halls as much as possible.  She should try to become more more independent.  I will check on the pathology reports today.  I explained to her that I would be out of town and have [place] provide coverage.  She is aware.  I discussed this with her husband as well.

## 2020-01-23 NOTE — Progress Notes (Signed)
PT Cancellation Note  Patient Details Name: Tabitha Knight MRN: [id number] DOB: 10-28-1955   Cancelled Treatment:     PT attempt. 2nd attempt this date. First attempt 1124- pt just returned to bed from OOB activity and requested therapist return at later time. 2nd attempt at 78- pt currently using BR with RN staff. Acute PT will continue to follow per POC. Encourage OOB and ambulation with RN staff as often as able and per pt tolerance.    Willette Pa 01/23/2020, 2:26 PM

## 2020-01-24 ENCOUNTER — Inpatient Hospital Stay: Payer: 59

## 2020-01-24 MED ORDER — ALBUTEROL SULFATE (2.5 MG/3ML) 0.083% IN NEBU: 2.5000 mg | INHALATION_SOLUTION | RESPIRATORY_TRACT | Status: DC | PRN

## 2020-01-24 MED ORDER — IPRATROPIUM-ALBUTEROL 0.5-2.5 (3) MG/3ML IN SOLN
3.0000 mL | Freq: Three times a day (TID) | RESPIRATORY_TRACT | Status: DC
  Administered 2020-01-24 – 2020-01-28 (×7): 3 mL via RESPIRATORY_TRACT
  Filled 2020-01-24 (×9): qty 3

## 2020-01-24 NOTE — Progress Notes (Addendum)
[facility] Day(s): 4.   Post op day(s): 4 Days Post-Op.   Interval History:  Patient seen and examined no acute events or new complaints overnight.  Patient reports she is feeling better, still with incisional soreness but improving No fever, chills, nausea, SOB, CP No new labs this morning.  Chest tube with 200 ccs; serosanguinous, she continues to have small intermittent grade 1-2 air leak with coughing Tolerating diet, having bowel function Worked with therapies; no recommendations  Vital signs in last 24 hours: [min-max] current  Temp:  [97.8 F (36.6 C)-98.1 F (36.7 C)] 97.8 F (36.6 C) (10/01 0404) Pulse Rate:  [90-101] 90 (10/01 0404) Resp:  [16-20] 16 (10/01 0404) BP: (112-133)/(64-67) 128/67 (10/01 0404) SpO2:  [94 %-100 %] 98 % (10/01 0404)     Height: 5\' 1"  (154.9 cm) Weight: 59 kg BMI (Calculated): 24.58   Intake/Output last 2 shifts:  09/30 0701 - 10/01 0700 In: 480 [P.O.:480] Out: 200 [Chest Tube:200]   Physical Exam:  Constitutional: alert, cooperative and no distress  Respiratory: breathing non-labored at rest, CTAB Chest: Chest tube to the left chest wall, dressing CDI, output is serosanguinous, she has an intermittent grade 1-2 air leak Cardiovascular: regular rate and sinus rhythm   Labs:  CBC Latest Ref Rng & Units 01/21/2020 01/16/2020 03/08/2019  WBC 4.0 - 10.5 K/uL 13.2(H) 5.6 5.8  Hemoglobin 12.0 - 15.0 g/dL 13.3 14.0 14.9  Hematocrit 36 - 46 % 35.5(L) 39.6 41.9  Platelets 150 - 400 K/uL 210 219 210   CMP Latest Ref Rng & Units 01/21/2020 01/16/2020 12/25/2019  Glucose 70 - 99 mg/dL 134(H) 94 -  BUN 8 - 23 mg/dL <5(L) 7(L) 8  Creatinine 0.44 - 1.00 mg/dL 0.36(L) 0.41(L) 0.48  Sodium 135 - 145 mmol/L 139 140 -  Potassium 3.5 - 5.1 mmol/L 3.7 4.0 -  Chloride 98 - 111 mmol/L 102 102 -  CO2 22 - 32 mmol/L 27 29 -  Calcium 8.9 - 10.3 mg/dL 9.0 9.1 -  Total Protein 6.5 - 8.1 g/dL - 7.4 -  Total  Bilirubin 0.3 - 1.2 mg/dL - 1.0 -  Alkaline Phos 38 - 126 U/L - 77 -  AST 15 - 41 U/L - 42(H) -  ALT 0 - 44 U/L - 42 -    Imaging studies: No new pertinent imaging studies   Assessment/Plan:  64 y.o. female with persistent air leak 4 Days Post-Op s/p left thoracotomy with wedge resection left upper lobe mass and completion left upper lobectomy for left upper lobe mass (pathology + for adenocarcinoma)   - Trialed chest tube on water seal however repeat CXR showed return of PTX, will return to -10 cm suction, leave for today, reassess readiness to return to water-seal once air leak resolved.   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 Completion LUL with benign changes and emphysema. Will need oncology consultation/follow up    All of the above findings and recommendations were discussed with the patient, and the medical team, and all of patient's questions were answered to her expressed satisfaction.  -- Edison Simon, PA-C [facility] 01/24/2020, 7:17 AM [phone] M-F: 7am - 4pm

## 2020-01-24 NOTE — Progress Notes (Signed)
PT Cancellation Note  Patient Details Name: Tabitha Knight MRN: [id number] DOB: 10-30-1955   Cancelled Treatment:     PT attempt. Currently having chest x ray. Will return later this date.   Willette Pa 01/24/2020, 9:14 AM

## 2020-01-24 NOTE — Progress Notes (Signed)
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    PT Comments    Pt was sitting in recliner upon arriving. She is A and O x 4 and cooperative and pleasant. Supportive spouse at bedside. She was able to stand without AD and ambulate 500 ft in hallway while holding husbands hand for support. No LOB or difficulty with ambulation. Slow cadence due to pain in chest tube insertion site. Overall pt is doing well from PT standpoint. Pt request to be able to I'ly get up and ambulate throughout the day. Discussed with RN that pt is cleared from PT standpoint to safely do so.     Follow Up Recommendations  Follow surgeons recommendation for DC plan and follow-up therapies     Equipment Recommendations  None recommended by PT    Recommendations for Other Services       Precautions / Restrictions Precautions Precautions: None Precaution Comments: chest tube Restrictions Weight Bearing Restrictions: No    Mobility  Bed Mobility    General bed mobility comments: Pt was sitting in recliner pre/post session  Transfers Overall transfer level: Modified independent Equipment used: None     Ambulation/Gait Ambulation/Gait assistance: Modified independent (Device/Increase time) Gait Distance (Feet): 500 Feet Assistive device: 1 person hand held assist (held husband hand during gait) Gait Pattern/deviations: WFL(Within Functional Limits) Gait velocity: decreased   General Gait Details: pt was easily able to ambulate 500 ft while holding husbands hand for support. slow cadence 2/2 pain in to chest tube insertion location     Balance Overall balance assessment: Modified Independent         Cognition Arousal/Alertness:  Awake/alert Behavior During Therapy: WFL for tasks assessed/performed Overall Cognitive Status: Within Functional Limits for tasks assessed      General Comments: Pt is A and O x 4. husband present and very supportive             Pertinent Vitals/Pain Pain Assessment: 0-10 Pain Score: 3  Pain Location: L rib cage, tube insertion - increases with movement Pain Descriptors / Indicators: Discomfort;Sore;Tender Pain Intervention(s): Limited activity within patient's tolerance;Monitored during session;Repositioned           PT Goals (current goals can now be found in the care plan section) Acute Rehab PT Goals Patient Stated Goal: Go home when they say I'm ready Progress towards PT goals: Progressing toward goals    Frequency    Min 2X/week      PT Plan Current plan remains appropriate       AM-PAC PT "6 Clicks" Mobility   Outcome Measure  Help needed turning from your back to your side while in a flat bed without using bedrails?: None Help needed moving from lying on your back to sitting on the side of a flat bed without using bedrails?: None Help needed moving to and from a bed to a chair (including a wheelchair)?: None Help needed standing up from a chair using your arms (e.g., wheelchair or bedside chair)?: None Help needed to walk in hospital room?: None Help needed climbing 3-5 steps with a railing? : A Little 6 Click Score: 23    End of Session Equipment Utilized During Treatment: Other (comment) (chest tube) Activity Tolerance: Patient tolerated treatment well Patient left:  in chair;with call bell/phone within reach;with family/visitor present Nurse Communication: Mobility status PT Visit Diagnosis: Muscle weakness (generalized) (M62.81);Difficulty in walking, not elsewhere classified (R26.2);Unsteadiness on feet (R26.81)     Time: 2951-8841 PT Time Calculation (min) (ACUTE ONLY): 13 min  Charges:  $Gait Training: 8-22 mins                     Julaine Fusi PTA 01/24/20, 11:31 AM

## 2020-01-25 ENCOUNTER — Inpatient Hospital Stay: Payer: 59

## 2020-01-25 NOTE — Progress Notes (Signed)
Subjective:  CC: Tabitha Knight is a 64 y.o. female  Hospital stay day 5, 5 Days Post-Op s/p left thoracotomy with wedge resection left upper lobe mass and completion left upper lobectomy for left upper lobe mass (pathology + for adenocarcinoma)  HPI: No acute issues overnight.  ROS:  General: Denies weight loss, weight gain, fatigue, fevers, chills, and night sweats. Heart: Denies chest pain, palpitations, racing heart, irregular heartbeat, leg pain or swelling, and decreased activity tolerance. Respiratory: Denies breathing difficulty, shortness of breath, wheezing, cough, and sputum. GI: Denies change in appetite, heartburn, nausea, vomiting, constipation, diarrhea, and blood in stool. GU: Denies difficulty urinating, pain with urinating, urgency, frequency, blood in urine.   Objective:   Temp:  [97.5 F (36.4 C)-98.5 F (36.9 C)] 98.5 F (36.9 C) (10/02 0440) Pulse Rate:  [82-93] 86 (10/02 0440) Resp:  [16-20] 16 (10/02 0440) BP: (117-139)/(64-80) 117/80 (10/02 0440) SpO2:  [96 %-100 %] 99 % (10/02 0440)     Height: 5\' 1"  (154.9 cm) Weight: 59 kg BMI (Calculated): 24.58   Intake/Output this shift:   Intake/Output Summary (Last 24 hours) at 01/25/2020 1158 Last data filed at 01/25/2020 1023 Gross per 24 hour  Intake 240 ml  Output 500 ml  Net -260 ml    Constitutional :  alert, cooperative, appears stated age and no distress  Respiratory:  clear to auscultation bilaterally.  Left chest tube with 2 column air bubbles noted  Cardiovascular:  regular rate and rhythm  Gastrointestinal: soft, non-tender; bowel sounds normal; no masses,  no organomegaly.   Skin: Cool and moist.   Psychiatric: Normal affect, non-agitated, not confused       LABS:  CMP Latest Ref Rng & Units 01/21/2020 01/16/2020 12/25/2019  Glucose 70 - 99 mg/dL 134(H) 94 -  BUN 8 - 23 mg/dL <5(L) 7(L) 8  Creatinine 0.44 - 1.00 mg/dL 0.36(L) 0.41(L) 0.48  Sodium 135 - 145 mmol/L 139 140 -  Potassium 3.5 -  5.1 mmol/L 3.7 4.0 -  Chloride 98 - 111 mmol/L 102 102 -  CO2 22 - 32 mmol/L 27 29 -  Calcium 8.9 - 10.3 mg/dL 9.0 9.1 -  Total Protein 6.5 - 8.1 g/dL - 7.4 -  Total Bilirubin 0.3 - 1.2 mg/dL - 1.0 -  Alkaline Phos 38 - 126 U/L - 77 -  AST 15 - 41 U/L - 42(H) -  ALT 0 - 44 U/L - 42 -   CBC Latest Ref Rng & Units 01/21/2020 01/16/2020 03/08/2019  WBC 4.0 - 10.5 K/uL 13.2(H) 5.6 5.8  Hemoglobin 12.0 - 15.0 g/dL 13.3 14.0 14.9  Hematocrit 36 - 46 % 35.5(L) 39.6 41.9  Platelets 150 - 400 K/uL 210 219 210    RADS: CLINICAL DATA:  Status post lobectomy, chest tube in place  EXAM: PORTABLE CHEST 1 VIEW  COMPARISON:  01/24/2020  FINDINGS: Surgical changes in the left hemithorax. Left apical and left basilar chest tubes. No pneumothorax is seen. Skin staples overlying the left lateral chest wall.  Right lung is clear.  The heart is normal in size.  Cervical spine fixation hardware.  IMPRESSION: Surgical changes in the left hemithorax. Left apical and left basilar chest tubes. No pneumothorax is seen.   Electronically Signed   By: Julian Hy M.D.   On: 01/25/2020 09:14  Assessment:   s/p left thoracotomy with wedge resection left upper lobe mass and completion left upper lobectomy for left upper lobe mass (pathology + for adenocarcinoma)  Despite chest  x-ray showing resolution of pneumothorax, there is still evidence of a persistent air leak from recent procedure.  Due to her failure from previous water seal,  continue with suction throughout the weekend and reassess in a couple days.  Otherwise continue present management.

## 2020-01-26 NOTE — Progress Notes (Signed)
Subjective:  CC: Tabitha Knight is a 64 y.o. female  Hospital stay day 6, 6 Days Post-Op s/p left thoracotomy with wedge resection left upper lobe mass and completion left upper lobectomy for left upper lobe mass (pathology + for adenocarcinoma)  HPI: No acute issues overnight.  ROS:  General: Denies weight loss, weight gain, fatigue, fevers, chills, and night sweats. Heart: Denies chest pain, palpitations, racing heart, irregular heartbeat, leg pain or swelling, and decreased activity tolerance. Respiratory: Denies breathing difficulty, shortness of breath, wheezing, cough, and sputum. GI: Denies change in appetite, heartburn, nausea, vomiting, constipation, diarrhea, and blood in stool. GU: Denies difficulty urinating, pain with urinating, urgency, frequency, blood in urine.   Objective:   Temp:  [98 F (36.7 C)-98.6 F (37 C)] 98 F (36.7 C) (10/03 0431) Pulse Rate:  [86-92] 92 (10/03 1236) Resp:  [16-20] 16 (10/03 1236) BP: (124-139)/(52-79) 139/79 (10/03 1236) SpO2:  [97 %-98 %] 97 % (10/03 1236)     Height: 5\' 1"  (154.9 cm) Weight: 59 kg BMI (Calculated): 24.58   Intake/Output this shift:   Intake/Output Summary (Last 24 hours) at 01/26/2020 1427 Last data filed at 01/26/2020 1016 Gross per 24 hour  Intake 600 ml  Output 1860 ml  Net -1260 ml    Constitutional :  alert, cooperative, appears stated age and no distress  Respiratory:  clear to auscultation bilaterally.  Left chest tube with 1 column air bubbles noted this am.    Cardiovascular:  regular rate and rhythm  Gastrointestinal: soft, non-tender; bowel sounds normal; no masses,  no organomegaly.   Skin: Cool and moist. Left chest staple line with bruising and likely hematoma deep to incision, minimal TTP and no discharge.  Chest tube dressings intact.  Psychiatric: Normal affect, non-agitated, not confused       LABS:  CMP Latest Ref Rng & Units 01/21/2020 01/16/2020 12/25/2019  Glucose 70 - 99 mg/dL 134(H) 94 -   BUN 8 - 23 mg/dL <5(L) 7(L) 8  Creatinine 0.44 - 1.00 mg/dL 0.36(L) 0.41(L) 0.48  Sodium 135 - 145 mmol/L 139 140 -  Potassium 3.5 - 5.1 mmol/L 3.7 4.0 -  Chloride 98 - 111 mmol/L 102 102 -  CO2 22 - 32 mmol/L 27 29 -  Calcium 8.9 - 10.3 mg/dL 9.0 9.1 -  Total Protein 6.5 - 8.1 g/dL - 7.4 -  Total Bilirubin 0.3 - 1.2 mg/dL - 1.0 -  Alkaline Phos 38 - 126 U/L - 77 -  AST 15 - 41 U/L - 42(H) -  ALT 0 - 44 U/L - 42 -   CBC Latest Ref Rng & Units 01/21/2020 01/16/2020 03/08/2019  WBC 4.0 - 10.5 K/uL 13.2(H) 5.6 5.8  Hemoglobin 12.0 - 15.0 g/dL 13.3 14.0 14.9  Hematocrit 36 - 46 % 35.5(L) 39.6 41.9  Platelets 150 - 400 K/uL 210 219 210    RADS: n/a Assessment:   s/p left thoracotomy with wedge resection left upper lobe mass and completion left upper lobectomy for left upper lobe mass (pathology + for adenocarcinoma)  still evidence of a persistent air leak from recent procedure.  continue with suction   Otherwise continue present management.

## 2020-01-27 ENCOUNTER — Encounter: Payer: Self-pay | Admitting: Cardiothoracic Surgery

## 2020-01-27 ENCOUNTER — Inpatient Hospital Stay: Payer: 59

## 2020-01-27 NOTE — Progress Notes (Signed)
Patient ID: Tabitha Knight, female   DOB: 28-Mar-1956, 64 y.o.   MRN: [id number]   No complaints.  Walking, eating, moving bowels, minimal pain  Wounds clean and dry.  I changed all her dressings today.  There is a very small air leak  CXRay shows tiny pneumothorax.  Will place to water seal trial.  Repeat CXRay this afternoon.

## 2020-01-28 ENCOUNTER — Inpatient Hospital Stay: Payer: 59

## 2020-01-28 LAB — CBC
HCT: 36.7 % (ref 36.0–46.0)
Hemoglobin: 12.9 g/dL (ref 12.0–15.0)
MCH: 31.8 pg (ref 26.0–34.0)
MCHC: 35.1 g/dL (ref 30.0–36.0)
MCV: 90.4 fL (ref 80.0–100.0)
Platelets: 330 10*3/uL (ref 150–400)
RBC: 4.06 MIL/uL (ref 3.87–5.11)
RDW: 11.2 % — ABNORMAL LOW (ref 11.5–15.5)
WBC: 7.9 10*3/uL (ref 4.0–10.5)
nRBC: 0 % (ref 0.0–0.2)

## 2020-01-28 LAB — BASIC METABOLIC PANEL
Anion gap: 10 (ref 5–15)
BUN: 7 mg/dL — ABNORMAL LOW (ref 8–23)
CO2: 27 mmol/L (ref 22–32)
Calcium: 8.8 mg/dL — ABNORMAL LOW (ref 8.9–10.3)
Chloride: 101 mmol/L (ref 98–111)
Creatinine, Ser: 0.44 mg/dL (ref 0.44–1.00)
GFR calc non Af Amer: >60 mL/min (ref >60)
Glucose, Bld: 134 mg/dL — ABNORMAL HIGH (ref 70–99)
Potassium: 3.5 mmol/L (ref 3.5–5.1)
Sodium: 138 mmol/L (ref 135–145)

## 2020-01-28 MED ORDER — IPRATROPIUM-ALBUTEROL 0.5-2.5 (3) MG/3ML IN SOLN
3.0000 mL | Freq: Two times a day (BID) | RESPIRATORY_TRACT | Status: DC
  Administered 2020-01-29 – 2020-01-30 (×2): 3 mL via RESPIRATORY_TRACT
  Filled 2020-01-28 (×4): qty 3

## 2020-01-28 NOTE — Progress Notes (Signed)
Patient ID: Tabitha Knight, female   DOB: [birth year], 64 y.o.   MRN: [id number]  Pain under good control.  Using only Tylenol.  Wounds clean and dry.  Small air leak with Valsalva  Will check routine labs and 2 view CXRay today.

## 2020-01-28 NOTE — Progress Notes (Signed)
PT Cancellation Note  Patient Details Name: Tabitha Knight MRN: [id number] DOB: Jun 30, 1955   Cancelled Treatment:    Reason Eval/Treat Not Completed: Other (comment)   Pt bathing on her own at the sink.  Stated she is moving well with no mobility issues voiced.  Stated she is walking with her husband on the unit and he will be here shortly and she plans gait with him.  Declined PT interventions at this time.  Pt progressing well with independent mobility skills on unit.   Chesley Noon 01/28/2020, 9:16 AM

## 2020-01-29 MED ORDER — TRAMADOL HCL 50 MG PO TABS
50.0000 mg | ORAL_TABLET | Freq: Four times a day (QID) | ORAL | Status: DC | PRN
  Administered 2020-01-29 – 2020-01-30 (×4): 50 mg via ORAL
  Filled 2020-01-29 (×4): qty 1

## 2020-01-29 MED ORDER — LORATADINE 10 MG PO TABS
10.0000 mg | ORAL_TABLET | Freq: Every day | ORAL | Status: DC
  Administered 2020-01-29 – 2020-01-30 (×2): 10 mg via ORAL
  Filled 2020-01-29 (×2): qty 1

## 2020-01-29 NOTE — Progress Notes (Signed)
Order received for zyrtec from Dr Genevive Bi per patient request. Formulary ordered claritin since hospital doesn't carry zyrtec

## 2020-01-29 NOTE — Progress Notes (Signed)
Order received from Dr Genevive Bi to discontinue robaxin and ensure

## 2020-01-29 NOTE — Progress Notes (Signed)
Patient ID: Tabitha Knight, female   DOB: Aug 26, 1955, 64 y.o.   MRN: [id number]  No problems.  Ambulating around the nursing station many times a day.  Eating well.  No pain.  Wounds clean and dry.  No air leak or minimal air leak seen.  Posterior angled tube removed  Lungs clear.  Heart regular  Will repeat CXray in the morning.

## 2020-01-29 NOTE — Progress Notes (Signed)
PT Cancellation Note  Patient Details Name: Tabitha Knight MRN: [id number] DOB: 1955/08/05   Cancelled Treatment:    Reason Eval/Treat Not Completed: Other (comment). Patient out walking in halls currently. Will complete PT order at this time as she is independent with mobility.    Quantarius Genrich 01/29/2020, 10:28 AM

## 2020-01-30 ENCOUNTER — Other Ambulatory Visit: Payer: Self-pay

## 2020-01-30 ENCOUNTER — Other Ambulatory Visit: Payer: 59

## 2020-01-30 ENCOUNTER — Inpatient Hospital Stay: Payer: 59

## 2020-01-30 DIAGNOSIS — R918 Other nonspecific abnormal finding of lung field: Secondary | ICD-10-CM

## 2020-01-30 MED ORDER — TRAMADOL HCL 50 MG PO TABS
50.0000 mg | ORAL_TABLET | Freq: Four times a day (QID) | ORAL | 0 refills | Status: DC | PRN
Start: 2020-01-30 — End: 2020-02-24

## 2020-01-30 NOTE — Progress Notes (Signed)
Tumor Board Documentation  Tabitha Knight was presented by Dr Genevive Bi at our Tumor Board on 01/30/2020, which included representatives from medical oncology, radiation oncology, surgical oncology, internal medicine, navigation, pathology, radiology, surgical, genetics, research, palliative care, pulmonology.  Tabitha Knight currently presents as a current patient, for [place], for new positive pathology with history of the following treatments: active survellience, surgical intervention(s).  Additionally, we reviewed previous medical and familial history, history of present illness, and recent lab results along with all available histopathologic and imaging studies. The tumor board considered available treatment options and made the following recommendations:   FU with Dr Tish Men to discuss treatment options  The following procedures/referrals were also placed: No orders of the defined types were placed in this encounter.   Clinical Trial Status: not discussed   Staging used: AJCC Stage Group  AJCC Staging: T: 1C N: 0   Group: Invasive Adenocarcinoma of Lung   National site-specific guidelines NCCN were discussed with respect to the case.  Tumor board is a meeting of clinicians from various specialty areas who evaluate and discuss patients for whom a multidisciplinary approach is being considered. Final determinations in the plan of care are those of the provider(s). The responsibility for follow up of recommendations given during tumor board is that of the provider.   Todays extended care, comprehensive team conference, Tabitha Knight was not present for the discussion and was not examined.   Multidisciplinary Tumor Board is a multidisciplinary case peer review process.  Decisions discussed in the Multidisciplinary Tumor Board reflect the opinions of the specialists present at the conference without having examined the patient.  Ultimately, treatment and diagnostic decisions rest with the primary  provider(s) and the patient.

## 2020-01-30 NOTE — Progress Notes (Addendum)
Initial Nutrition Assessment  DOCUMENTATION CODES:   Not applicable  INTERVENTION:   Recommend Ensure Enlive po BID, each supplement provides 350 kcal and 20 grams of protein  NUTRITION DIAGNOSIS:   Increased nutrient needs related to post-op healing as evidenced by increased estimated needs.  GOAL:   Patient will meet greater than or equal to 90% of their needs  MONITOR:   PO intake, Labs, Weight trends, I & O's, Supplement acceptance, Skin  REASON FOR ASSESSMENT:   LOS    ASSESSMENT:   64 y/o female with h/o HLD, anxiety, osteoporosis, spinal stenosis who is admitted with new lung mass now s/p left thoracotomy with wedge resection of left upper lobe mass and completion left upper lobectomy (pathology + for adenocarcinoma)   Met with pt in room today. Pt reports that she is feeling good today. Pt reports good appetite and oral intake; pt eating 75-95% of meals over the past 2 days. Pt reports that she has been drinking some Ensure; pt just requesting Ensure from RN when she wants one. RD discussed with pt the importance of adequate nutrition needed for post op healing. RD encouraged use of supplements until healing is complete. Per chart, pt appears fairly weight stable at baseline.   Medications reviewed and include: dulcolax, vitamin D  Labs reviewed: BUN 7(L)  NUTRITION - FOCUSED PHYSICAL EXAM:    Most Recent Value  Orbital Region No depletion  Upper Arm Region No depletion  Thoracic and Lumbar Region No depletion  Buccal Region No depletion  Temple Region No depletion  Clavicle Bone Region No depletion  Clavicle and Acromion Bone Region No depletion  Scapular Bone Region No depletion  Dorsal Hand No depletion  Patellar Region No depletion  Anterior Thigh Region No depletion  Posterior Calf Region No depletion  Edema (RD Assessment) None  Hair Reviewed  Eyes Reviewed  Mouth Reviewed  Skin Reviewed  Nails Reviewed     Diet Order:   Diet Order             Diet regular Room service appropriate? Yes; Fluid consistency: Thin  Diet effective now                EDUCATION NEEDS:   Education needs have been addressed  Skin:  Skin Assessment: Reviewed RN Assessment (incision chest)  Last BM:  10/6  Height:   Ht Readings from Last 1 Encounters:  01/20/20 '5\' 1"'  (1.549 m)    Weight:   Wt Readings from Last 1 Encounters:  01/20/20 59 kg    Ideal Body Weight:  47.7 kg  BMI:  Body mass index is 24.56 kg/m.  Estimated Nutritional Needs:   Kcal:  1400-1600kcal/day  Protein:  70-80g/day  Fluid:  >1.4L/day  Koleen Distance MS, RD, LDN Please refer to [facility] for RD and/or RD on-call/weekend/after hours pager

## 2020-01-30 NOTE — Progress Notes (Signed)
Patient ID: Tabitha Knight, female   DOB: 05-24-1955, 64 y.o.   MRN: [id number]  No new problems.  No air leak seen.  Posterior angled tube removed yesterday.  Will clamp chest tube after CXRay this morning if no Pneumothorax.

## 2020-02-03 ENCOUNTER — Other Ambulatory Visit: Payer: Self-pay

## 2020-02-03 DIAGNOSIS — R918 Other nonspecific abnormal finding of lung field: Secondary | ICD-10-CM

## 2020-02-03 NOTE — Discharge Summary (Signed)
[facility] SURGICAL DISCHARGE SUMMARY  Patient ID: Tabitha Knight MRN: [id number] DOB/AGE: 1955-06-10 64 y.o.  Admit date: 01/20/2020 Discharge date: 02/03/2020  Discharge Diagnoses Patient Active Problem List   Diagnosis Date Noted  . Lung cancer ([place]) 01/20/2020  . Lung mass 01/03/2020  . S/P cervical spinal fusion 03/13/2019    Procedures #1 preoperative bronchoscopy to assess endobronchial anatomy #2 left thoracotomy with wedge resection left upper lobe mass and completion left upper lobectomy  HPI: Tabitha Knight is a 64 y.o. female with a history of left upper lobe mass who presents for thoracotomy and resection with Dr Genevive Bi.   Hospital Course: Informed consent was obtained and documented, and patient underwent uneventful left thoracoctomy and LUL resection (Dr Genevive Bi, 01/20/2020).  Post-operatively, patient's main issues were air leak. This ultimately resolved and chest tubes were removed sequentially. Advancement of patient's diet and ambulation were well-tolerated. The remainder of patient's hospital course was essentially unremarkable, and discharge planning was initiated accordingly with patient safely able to be discharged home with appropriate discharge instructions, pain control, and outpatient follow-up after all of her questions were answered to her expressed satisfaction.  Discharge Condition: Good   Physical Examination:  Constitutional: Well appearing female, NAD Pulmonary: Normal effort, no respiratory distress Skin: Thoracotomy incision is well healed, steri-strips in place, no drainage or erythema   Allergies as of 01/30/2020      Reactions   Meperidine Hcl    Unknown reaction   Propoxyphene Hcl    Unknown reaction      Medication List    TAKE these medications   ALPRAZolam 0.25 MG tablet Commonly known as: XANAX Take 1 tablet (0.25 mg total) by mouth 2 (two) times daily as needed. for anxiety What changed: when to take this    aspirin EC 81 MG tablet Take 81 mg by mouth daily.   atorvastatin 80 MG tablet Commonly known as: LIPITOR Take 80 mg by mouth daily at 6 PM.   calcium carbonate 500 MG chewable tablet Commonly known as: TUMS - dosed in mg elemental calcium Chew 1-2 tablets by mouth daily as needed for indigestion or heartburn.   cetirizine 10 MG tablet Commonly known as: ZYRTEC Take 10 mg by mouth daily after breakfast.   clobetasol ointment 0.05 % Commonly known as: TEMOVATE Apply 1 application topically 2 (two) times daily as needed (psoriasis).   fluticasone 50 MCG/ACT nasal spray Commonly known as: FLONASE Place 2 sprays into both nostrils daily as needed.   montelukast 10 MG tablet Commonly known as: SINGULAIR Take 10 mg by mouth at bedtime.   NETI POT SINUS [place] NA Place 1 Dose into the nose daily as needed (congestion).   Systane Complete 0.6 % Soln Generic drug: Propylene Glycol Apply 1 drop to eye daily as needed. 1 gtt each eye as needed for dry eyes   traMADol 50 MG tablet Commonly known as: ULTRAM Take 1 tablet (50 mg total) by mouth every 6 (six) hours as needed for moderate pain.   Vitamin D 50 MCG (2000 UT) tablet Take 2,000 Units by mouth daily with lunch.         Follow-up Information    Nestor Lewandowsky, MD. Schedule an appointment as soon as possible for a visit in 1 week(s).   Specialties: Cardiothoracic Surgery, General Surgery Why: s/p left thoracotomy and wedge resection, CXR prior to appointment . Your appointment is February 07, 2020 at 10:00. You need to have a chest x-ray done 1 or  2 days before your appointment. Go to the admitting desk in the [facility] information: [address] [place] [address] [phone]                Time spent on discharge management including discussion of hospital course, clinical condition, outpatient instructions, prescriptions, and follow up with the patient and members of  the medical team: >30 minutes  -- Edison Simon , PA-C [facility]  02/03/2020, 8:16 AM [phone] M-F: 7am - 4pm

## 2020-02-05 ENCOUNTER — Ambulatory Visit
Admission: RE | Admit: 2020-02-05 | Discharge: 2020-02-05 | Disposition: A | Discharge status: Home / Self Care | Payer: 59 | Source: Ambulatory Visit | Attending: Cardiothoracic Surgery | Admitting: Cardiothoracic Surgery

## 2020-02-05 DIAGNOSIS — R918 Other nonspecific abnormal finding of lung field: Secondary | ICD-10-CM | POA: Insufficient documentation

## 2020-02-07 ENCOUNTER — Encounter: Payer: Self-pay | Admitting: Cardiothoracic Surgery

## 2020-02-07 ENCOUNTER — Ambulatory Visit (INDEPENDENT_AMBULATORY_CARE_PROVIDER_SITE_OTHER): Payer: 59 | Admitting: Cardiothoracic Surgery

## 2020-02-07 ENCOUNTER — Other Ambulatory Visit: Payer: Self-pay

## 2020-02-07 VITALS — BP 133/69 | HR 92 | Temp 98.2°F | Ht 61.0 in | Wt 128.6 lb

## 2020-02-07 DIAGNOSIS — R918 Other nonspecific abnormal finding of lung field: Secondary | ICD-10-CM

## 2020-02-07 NOTE — Progress Notes (Signed)
Ms. stent returns for her follow-up.  She is now about a week post discharge following a left upper lobectomy for stage I carcinoma of the lung.  She has been doing very well and has been walking.  Yesterday she walked about 5000 steps.  She has some numbness around her incision and around her left breast but otherwise has no shortness of breath or other issues.  Her lungs are clear bilaterally.  Her heart is regular.  Her thoracotomy wound is well-healed.  I removed her chest tube stitch.  She did have a chest x-ray which have independently reviewed.  There is a small air-fluid level in the left chest.  At the present time she appears to be doing quite well after surgery.  She is scheduled to follow-up with our oncologist next week.  We will get a chest x-ray in 3 days to follow-up the small hydropneumothorax.  I told her I would call her if there are any issues with that.  Also I will see her back again in 3 weeks time.

## 2020-02-07 NOTE — Patient Instructions (Addendum)
You may use Advil if needed for pain. Continue to use your incentive spirometer.   Continue to increase activity as tolerated. No lifting more than 10 pounds for 1 month after surgery.   Have a chest x ray done on Monday before you see Dr Rogue Bussing. We will call you with these results.   You can have this done at the [facility] across the street from our office.   Follow up here in 3 weeks with a chest x ray prior.

## 2020-02-10 ENCOUNTER — Ambulatory Visit
Admission: RE | Admit: 2020-02-10 | Discharge: 2020-02-10 | Disposition: A | Discharge status: Home / Self Care | Payer: 59 | Source: Ambulatory Visit | Attending: Cardiothoracic Surgery | Admitting: Cardiothoracic Surgery

## 2020-02-10 ENCOUNTER — Ambulatory Visit
Admission: RE | Admit: 2020-02-10 | Discharge: 2020-02-10 | Disposition: A | Discharge status: Home / Self Care | Payer: 59 | Source: Home / Self Care | Attending: Cardiothoracic Surgery | Admitting: Cardiothoracic Surgery

## 2020-02-10 ENCOUNTER — Encounter: Payer: Self-pay | Admitting: *Deleted

## 2020-02-10 ENCOUNTER — Other Ambulatory Visit: Payer: Self-pay

## 2020-02-10 ENCOUNTER — Encounter: Payer: Self-pay | Admitting: Internal Medicine

## 2020-02-10 ENCOUNTER — Inpatient Hospital Stay: Payer: 59 | Attending: Internal Medicine | Admitting: Internal Medicine

## 2020-02-10 DIAGNOSIS — R918 Other nonspecific abnormal finding of lung field: Secondary | ICD-10-CM | POA: Insufficient documentation

## 2020-02-10 DIAGNOSIS — Z902 Acquired absence of lung [part of]: Secondary | ICD-10-CM | POA: Diagnosis not present

## 2020-02-10 DIAGNOSIS — Z8349 Family history of other endocrine, nutritional and metabolic diseases: Secondary | ICD-10-CM | POA: Diagnosis not present

## 2020-02-10 DIAGNOSIS — F419 Anxiety disorder, unspecified: Secondary | ICD-10-CM | POA: Diagnosis not present

## 2020-02-10 DIAGNOSIS — Z803 Family history of malignant neoplasm of breast: Secondary | ICD-10-CM | POA: Insufficient documentation

## 2020-02-10 DIAGNOSIS — Z7982 Long term (current) use of aspirin: Secondary | ICD-10-CM | POA: Insufficient documentation

## 2020-02-10 DIAGNOSIS — E785 Hyperlipidemia, unspecified: Secondary | ICD-10-CM | POA: Diagnosis not present

## 2020-02-10 DIAGNOSIS — Z833 Family history of diabetes mellitus: Secondary | ICD-10-CM | POA: Insufficient documentation

## 2020-02-10 DIAGNOSIS — Z79899 Other long term (current) drug therapy: Secondary | ICD-10-CM | POA: Diagnosis not present

## 2020-02-10 DIAGNOSIS — Z9049 Acquired absence of other specified parts of digestive tract: Secondary | ICD-10-CM | POA: Insufficient documentation

## 2020-02-10 DIAGNOSIS — C3412 Malignant neoplasm of upper lobe, left bronchus or lung: Secondary | ICD-10-CM | POA: Insufficient documentation

## 2020-02-10 DIAGNOSIS — J439 Emphysema, unspecified: Secondary | ICD-10-CM | POA: Diagnosis not present

## 2020-02-10 DIAGNOSIS — Z87891 Personal history of nicotine dependence: Secondary | ICD-10-CM | POA: Diagnosis not present

## 2020-02-10 DIAGNOSIS — Z8249 Family history of ischemic heart disease and other diseases of the circulatory system: Secondary | ICD-10-CM | POA: Diagnosis not present

## 2020-02-10 NOTE — Progress Notes (Signed)
[facility] NOTE  Patient Care Team: Kirk Ruths, MD as PCP - General (Internal Medicine) Telford Nab, RN as Oncology Nurse Navigator Cammie Sickle, MD as Consulting Physician (Internal Medicine)  CHIEF COMPLAINTS/PURPOSE OF CONSULTATION: lung cancer  Oncology History Overview Note   #SEP 2021- Left upper lobe nodule- 1.3 x 1.8 x 2.1 cm-spiculated abutting major fissure-highly concerning for primary lung cancer; 7 mm triangular subpleural nodule-question benign; SEP 27th 2021- s/p LOBECTOMY- pT1cN0- STAGE I- adenocarcinoma- Surveillaince  #Smoking-10 years [quit August 2021]; neck surgery [November 2020 Dr. 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  Visceral Pleura Invasion: Not identified  Lymphovascular Invasion: Not identified  Direct Invasion of Adjacent Structures: No adjacent structures present  Margins: All margins uninvolved by tumor  Margins examined: Bronchial, vascular, and parenchymal  Regional Lymph Nodes: Number of Lymph Nodes Involved: 0  Number of Lymph Nodes Examined: 5   Treatment Effect: No known presurgical therapy  Pathologic Stage Classification (pTNM, AJCC 8th Edition): pT1c pN0   # SURVIVORSHIP:   # GENETICS:   DIAGNOSIS: Lung cancer  STAGE:  I       ;  GOALS: cure  CURRENT/MOST RECENT THERAPY : surveillaince    Primary cancer of left upper lobe of lung ([place])  02/10/2020 Initial Diagnosis   Primary cancer of left upper lobe of lung ([place])      HISTORY OF PRESENTING ILLNESS:  Tabitha Knight 64 y.o.  female with recently diagnosed lung cancer is here s/p surgery for follow-up.  Patient is recovering from surgery uneventful except she  developed air leak for surgery.  Patient had chest x-ray last week that showed small air-fluid level.  She had a repeat chest x-ray this morning for a follow-up.  Complains of pain at the site of her surgical incision.  Otherwise denies any nausea vomiting headaches.   Review of Systems  Constitutional: Negative for chills, diaphoresis, fever, malaise/fatigue and weight loss.  HENT: Negative for nosebleeds and sore throat.   Eyes: Negative for double vision.  Respiratory: Negative for cough, hemoptysis, sputum production, shortness of breath and wheezing.   Cardiovascular: Negative for chest pain, palpitations, orthopnea and leg swelling.  Gastrointestinal: Negative for abdominal pain, blood in stool, constipation, diarrhea, heartburn, melena, nausea and vomiting.  Genitourinary: Negative for dysuria, frequency and urgency.  Musculoskeletal: Negative for back pain and joint pain.  Skin: Negative.  Negative for itching and rash.  Neurological: Negative for dizziness, tingling, focal weakness, weakness and headaches.  Endo/Heme/Allergies: Does not bruise/bleed easily.  Psychiatric/Behavioral: Negative for depression. The patient is not nervous/anxious and does not have insomnia.      MEDICAL HISTORY:  Past Medical History:  Diagnosis Date  . Anxiety   . Arthritis   . History of hiatal hernia   . HLD (hyperlipidemia)   . Osteoporosis   . Psoriasis   . Sinus congestion     SURGICAL HISTORY: Past Surgical History:  Procedure Laterality Date  . ANTERIOR CERVICAL DECOMP/DISCECTOMY FUSION N/A 03/13/2019   Procedure: ANTERIOR CERVICAL DECOMPRESSION/DISCECTOMY FUSION 2 LEVELS C5-7;  Surgeon: Meade Maw, MD;  Location: [facility] ORS;  Service:  Neurosurgery;  Laterality: N/A;  . APPENDECTOMY    . CHOLECYSTECTOMY    . EXPLORATORY LAPAROTOMY  1988   removed appendix  . [place]  . NOVASURE ABLATION    . THORACOTOMY/LOBECTOMY Left 01/20/2020   Procedure:  THORACOTOMY/LOBECTOMY;  Surgeon: Nestor Lewandowsky, MD;  Location: [facility] ORS;  Service: Thoracic;  Laterality: Left;    SOCIAL HISTORY: Social History   Socioeconomic History  . Marital status: Married    Spouse name: Not on file  . Number of children: Not on file  . Years of education: Not on file  . Highest education level: Not on file  Occupational History  . Not on file  Tobacco Use  . Smoking status: Former Smoker    Packs/day: 0.25    Types: Cigarettes  . Smokeless tobacco: Never Used  Vaping Use  . Vaping Use: Never used  Substance and Sexual Activity  . Alcohol use: Yes  . Drug use: No  . Sexual activity: Never  Other Topics Concern  . Not on file  Social History Narrative   Quit smoking- Dec 08, 2019- [started in 50s]; light beer/ wine daily; insurance retd. Lives in husband; lives in [place].    Social Determinants of Health   Financial Resource Strain:   . Difficulty of Paying Living Expenses: Not on file  Food Insecurity:   . Worried About Charity fundraiser in the Last Year: Not on file  . Ran Out of Food in the Last Year: Not on file  Transportation Needs:   . Lack of Transportation (Medical): Not on file  . Lack of Transportation (Non-Medical): Not on file  Physical Activity:   . Days of Exercise per Week: Not on file  . Minutes of Exercise per Session: Not on file  Stress:   . Feeling of Stress : Not on file  Social Connections:   . Frequency of Communication with Friends and Family: Not on file  . Frequency of Social Gatherings with Friends and Family: Not on file  . Attends Religious Services: Not on file  . Active Member of Clubs or Organizations: Not on file  . Attends Archivist Meetings: Not on file  . Marital Status: Not on file  Intimate Partner Violence:   . Fear of Current or Ex-Partner: Not on file  . Emotionally Abused: Not on file  . Physically Abused: Not on file  . Sexually Abused: Not on file    FAMILY  HISTORY: Family History  Problem Relation Age of Onset  . Breast cancer Paternal Aunt   . Hypertension Mother   . Hyperlipidemia Mother   . Cirrhosis Mother   . Heart disease Father   . Diabetes Father   . Stroke Father     ALLERGIES:  is allergic to meperidine hcl and propoxyphene hcl.  MEDICATIONS:  Current Outpatient Medications  Medication Sig Dispense Refill  . ALPRAZolam (XANAX) 0.25 MG tablet Take 1 tablet (0.25 mg total) by mouth 2 (two) times daily as needed. for anxiety (Patient taking differently: Take 0.25 mg by mouth at bedtime. for anxiety) 45 tablet 4  . aspirin EC 81 MG tablet Take 81 mg by mouth daily.    Marland Kitchen atorvastatin (LIPITOR) 80 MG tablet Take 80 mg by mouth daily at 6 PM.     . calcium carbonate (TUMS - DOSED IN MG ELEMENTAL CALCIUM) 500 MG chewable tablet Chew 1-2 tablets by mouth daily as needed for indigestion or heartburn.    . cetirizine (  ZYRTEC) 10 MG tablet Take 10 mg by mouth daily after breakfast.     . Cholecalciferol (VITAMIN D) 50 MCG (2000 UT) tablet Take 2,000 Units by mouth daily with lunch.     . clobetasol ointment (TEMOVATE) 7.49 % Apply 1 application topically 2 (two) times daily as needed (psoriasis).     . fluticasone (FLONASE) 50 MCG/ACT nasal spray Place 2 sprays into both nostrils daily as needed.     . montelukast (SINGULAIR) 10 MG tablet Take 10 mg by mouth at bedtime.    Marland Kitchen Propylene Glycol (SYSTANE COMPLETE) 0.6 % SOLN Apply 1 drop to eye daily as needed. 1 gtt each eye as needed for dry eyes    . Sodium Chloride-Sodium Bicarb (NETI POT SINUS WASH NA) Place 1 Dose into the nose daily as needed (congestion).    . traMADol (ULTRAM) 50 MG tablet Take 1 tablet (50 mg total) by mouth every 6 (six) hours as needed for moderate pain. 30 tablet 0   No current facility-administered medications for this visit.      Marland Kitchen  PHYSICAL EXAMINATION: ECOG PERFORMANCE STATUS: 0 - Asymptomatic  Vitals:   02/10/20 0951  BP: 140/67  Pulse: 86  Resp:  16  Temp: 97.6 F (36.4 C)  SpO2: 100%   Filed Weights   02/10/20 0951  Weight: 130 lb (59 kg)    Physical Exam HENT:     Head: Normocephalic and atraumatic.     Mouth/Throat:     Pharynx: No oropharyngeal exudate.  Eyes:     Pupils: Pupils are equal, round, and reactive to light.  Cardiovascular:     Rate and Rhythm: Normal rate and regular rhythm.  Pulmonary:     Effort: No respiratory distress.     Breath sounds: No wheezing.  Abdominal:     General: Bowel sounds are normal. There is no distension.     Palpations: Abdomen is soft. There is no mass.     Tenderness: There is no abdominal tenderness. There is no guarding or rebound.  Musculoskeletal:        General: No tenderness. Normal range of motion.     Cervical back: Normal range of motion and neck supple.  Skin:    General: Skin is warm.  Neurological:     Mental Status: She is alert and oriented to person, place, and time.  Psychiatric:        Mood and Affect: Affect normal.      LABORATORY DATA:  I have reviewed the data as listed Lab Results  Component Value Date   WBC 7.9 01/28/2020   HGB 12.9 01/28/2020   HCT 36.7 01/28/2020   MCV 90.4 01/28/2020   PLT 330 01/28/2020   Recent Labs    12/25/19 1020 12/25/19 1020 01/16/20 0958 01/21/20 0644 01/28/20 0821  NA  --   --  140 139 138  K  --   --  4.0 3.7 3.5  CL  --   --  102 102 101  CO2  --   --  29 27 27   GLUCOSE  --   --  94 134* 134*  BUN 8   < > 7* <5* 7*  CREATININE 0.48   < > 0.41* 0.36* 0.44  CALCIUM  --   --  9.1 9.0 8.8*  GFRNONAA >60   < > >60 >60 >60  GFRAA >60  --  >60 >60  --   PROT  --   --  7.4  --   --  ALBUMIN  --   --  4.6  --   --   AST  --   --  42*  --   --   ALT  --   --  42  --   --   ALKPHOS  --   --  77  --   --   BILITOT  --   --  1.0  --   --    < > = values in this interval not displayed.    RADIOGRAPHIC STUDIES: I have personally reviewed the radiological images as listed and agreed with the findings in  the report. DG Chest 1 View  Result Date: 01/27/2020 CLINICAL DATA:  Pneumothorax. EXAM: CHEST  1 VIEW COMPARISON:  Two days ago FINDINGS: Postoperative left chest with 2 chest tubes in stable position. Small left pneumothorax seen at the apex and base. Mild atelectasis along the right minor fissure. Stable heart size. IMPRESSION: Small left pneumothorax. Electronically Signed   By: Monte Fantasia M.D.   On: 01/27/2020 05:31   DG Chest 1 View  Result Date: 01/24/2020 CLINICAL DATA:  Chest tube.  Thoracotomy 01/20/2020 EXAM: CHEST  1 VIEW COMPARISON:  01/22/2020 FINDINGS: 2 chest tubes on the left remain in place unchanged. Interval development of left pneumothorax measuring approximately 2 cm in diameter. Subcutaneous gas in the left neck and chest wall unchanged. No significant pleural effusion. Minimal left lower lobe atelectasis Mild right perihilar atelectasis has improved. Negative for heart failure. IMPRESSION: Interval development of 2 cm left pneumothorax. Left chest tubes remain in place. Electronically Signed   By: Franchot Gallo M.D.   On: 01/24/2020 09:29   DG Chest 2 View  Result Date: 02/07/2020 CLINICAL DATA:  64 year old female with history of thoracotomy and lobectomy on 01/20/2020 status post chest tube removal. EXAM: CHEST - 2 VIEW COMPARISON:  Chest x-ray 01/30/2020. FINDINGS: Status post left upper lobectomy, with compensatory hyperexpansion of the left lower lobe. Previously noted left-sided chest tube has been removed. There is a small left hydropneumothorax, the majority of which is loculated in the upper anterior left hemithorax, as evidenced by the presence of a small air-fluid level in this region. Right lung is clear. No right pleural effusion. No right pneumothorax. No evidence of pulmonary edema. Heart size is normal. Upper mediastinal contours are unremarkable. Surgical clips project over the right upper quadrant of the abdomen, likely from prior cholecystectomy.  Orthopedic fixation hardware projects over the lower cervical spine stretch that orthopedic fixation hardware in the lower cervical spine incidentally noted. IMPRESSION: 1. Status post removal left-sided chest tube with small residual partially loculated left hydropneumothorax, as above. These results will be called to the ordering clinician or representative by the Radiologist Assistant, and communication documented in the PACS or [employer]. Electronically Signed   By: Vinnie Langton M.D.   On: 02/07/2020 09:08   DG Chest 2 View  Result Date: 01/30/2020 CLINICAL DATA:  LEFT pneumothorax, chest tube EXAM: CHEST - 2 VIEW COMPARISON:  Exam at 1422 hours compared to 0801 hours same date FINDINGS: LEFT thoracostomy tube and skin clips again seen. Normal heart size, mediastinal contours, and pulmonary vascularity. Postsurgical changes at LEFT hilum. Minimal atelectasis at LEFT base. No pulmonary infiltrate or pleural effusion. Tiny pneumothorax at upper LEFT chest. Prior cervical spine fusion. IMPRESSION: Postsurgical changes of the LEFT hemithorax. Stable LEFT thoracostomy tube with tiny LEFT pneumothorax. Electronically Signed   By: Lavonia Dana M.D.   On: 01/30/2020 14:35   DG  Chest 2 View  Result Date: 01/30/2020 CLINICAL DATA:  Removal of inferior chest tube EXAM: CHEST-2 VIEW COMPARISON:  January 28, 2020. FINDINGS: Inferior chest tube removed. Other chest tube unchanged in position with tip directed toward the apex. There is no appreciable pneumothorax. There is atelectatic change in the left base with minimal left pleural effusion. Right lung clear. Heart size and pulmonary vascularity are within normal limits. No adenopathy. Postoperative change noted in lower cervical region. IMPRESSION: A single chest tube remains on the left without pneumothorax evident. Left base atelectasis with minimal left pleural effusion. No new opacity. Right lung clear. Stable cardiac silhouette. Electronically Signed    By: Lowella Grip III M.D.   On: 01/30/2020 08:28   DG Chest 2 View  Result Date: 01/28/2020 CLINICAL DATA:  Status post lobectomy with chest tubes in place EXAM: CHEST - 2 VIEW COMPARISON:  January 27, 2020 FINDINGS: Chest tube positions on the left are unchanged. Minimal left apical pneumothorax is stable. No tension component. There is postoperative change on the left with atelectatic change in the left base. The right lung is clear. Heart size and pulmonary vascularity are normal. No adenopathy. There is postoperative change in the lower cervical spine. IMPRESSION: Chest tube positions unchanged on the left with minimal left apical pneumothorax. Postoperative change on the left with mild left base atelectasis. No consolidation. Right lung clear. Stable cardiac silhouette. Electronically Signed   By: Lowella Grip III M.D.   On: 01/28/2020 10:14   Chest 2 View  Result Date: 01/16/2020 CLINICAL DATA:  64 year old female preoperative study for planned thoracotomy/lobectomy. EXAM: CHEST - 2 VIEW COMPARISON:  Chest CT 12/25/2019 and earlier. FINDINGS: Posterior left upper lobe lung nodule projects over the left 1st rib and the spine and the left 1st rib these PA and lateral views respectively. Somewhat large lung volumes are stable. Mediastinal contours remain normal. No pneumothorax, pulmonary edema, pleural effusion or new pulmonary opacity. Visualized tracheal air column is within normal limits. Stable visualized osseous structures. Prior cervical ACDF. Stable cholecystectomy clips. Negative visible bowel gas pattern. IMPRESSION: Known left upper lobe lung nodule. No new cardiopulmonary abnormality. Electronically Signed   By: Genevie Ann M.D.   On: 01/16/2020 16:30   DG Chest Port 1 View  Result Date: 01/27/2020 CLINICAL DATA:  Chest tube surveillance EXAM: PORTABLE CHEST 1 VIEW COMPARISON:  01/27/2020 FINDINGS: Postoperative changes to the left thorax with 2 large bore chest tubes remaining in  place. Trace residual left apical pneumothorax, slightly decreased from prior. Improving aeration of the lungs. No pleural effusion. Cervical ACDF. IMPRESSION: Trace residual left apical pneumothorax, slightly decreased from prior. Electronically Signed   By: Davina Poke D.O.   On: 01/27/2020 13:32   DG Chest Port 1 View  Result Date: 01/25/2020 CLINICAL DATA:  Status post lobectomy, chest tube in place EXAM: PORTABLE CHEST 1 VIEW COMPARISON:  01/24/2020 FINDINGS: Surgical changes in the left hemithorax. Left apical and left basilar chest tubes. No pneumothorax is seen. Skin staples overlying the left lateral chest wall. Right lung is clear. The heart is normal in size. Cervical spine fixation hardware. IMPRESSION: Surgical changes in the left hemithorax. Left apical and left basilar chest tubes. No pneumothorax is seen. Electronically Signed   By: Julian Hy M.D.   On: 01/25/2020 09:14   DG Chest Port 1 View  Result Date: 01/22/2020 CLINICAL DATA:  Chest tube surveillance. Left thoracotomy and lobectomy EXAM: PORTABLE CHEST 1 VIEW COMPARISON:  01/21/2020 FINDINGS: Stable heart  size. Two left-sided chest tubes remain unchanged in positioning. No appreciable residual left-sided pneumothorax. Continued improvement of the aeration of the right upper lobe. No pleural effusion. Left chest wall subcutaneous emphysema. IMPRESSION: 1. No appreciable residual left-sided pneumothorax. 2. Continued improvement of aeration of the right upper lobe. Electronically Signed   By: Davina Poke D.O.   On: 01/22/2020 09:24   DG Chest Port 1 View  Result Date: 01/21/2020 CLINICAL DATA:  Left chest surgery. EXAM: PORTABLE CHEST 1 VIEW COMPARISON:  01/20/2020. FINDINGS: Two left chest tubes noted in stable position. Stable tiny left apical pneumothorax. Persistent but/improved right infiltrate upper lobe. Heart size stable. Atelectasis stable left chest wall mild subcutaneous emphysema. Surgical staples and  left chest. Prior cervical spine fusion. IMPRESSION: 1. Two left chest tubes in stable position. Stable tiny left apical pneumothorax. Stable left chest wall mild subcutaneous emphysema. 2. Persistent but improved right upper lobe atelectasis/infiltrate. Electronically Signed   By: Marcello Moores  Register   On: 01/21/2020 07:32   DG Chest Port 1 View  Result Date: 01/20/2020 CLINICAL DATA:  Thoracotomy and lobectomy EXAM: PORTABLE CHEST 1 VIEW COMPARISON:  Chest x-ray 01/16/2020 FINDINGS: Postop thoracotomy on the left. Two chest tubes are in place on the left. Minimal pleural gas on the left. No effusion. Right upper lobe partial collapse has developed since prior studies. Heart size and vascularity normal. IMPRESSION: Postop thoracotomy on the left.  Minimal pleural gas on the left. Interval development of right upper lobe partial collapse. Electronically Signed   By: Franchot Gallo M.D.   On: 01/20/2020 15:37    ASSESSMENT & PLAN:   Primary cancer of left upper lobe of lung ([place]) #Left upper lobe lung cancer-adenocarcinoma; stage I.  I discussed the staging pathology with the patient in detail.  Discussed that patient that she has more than 80% chance of cure just with surgery itself. Given the early stage cancer patient would not benefit from any further adjuvant chemotherapy.   # RML 4 mm nodule [PET in sep 2021]-await CT scan in 6 months.  # post thoracotomy pain-clinically stable.  Continue NSAIDs.  #Air leak noted on previous chest x-ray; awaiting chest x-ray from today.  Will inform patient of  # DISPOSITION: # Follow up in 6 months- MD; CT chest prior-Dr.B   All questions were answered. The patient knows to call the clinic with any problems, questions or concerns.    Cammie Sickle, MD 02/10/2020 10:59 AM

## 2020-02-10 NOTE — Assessment & Plan Note (Addendum)
#  Left upper lobe lung cancer-adenocarcinoma; stage I.  I discussed the staging pathology with the patient in detail.  Discussed that patient that she has more than 80% chance of cure just with surgery itself. Given the early stage cancer patient would not benefit from any further adjuvant chemotherapy.   # RML 4 mm nodule [PET in sep 2021]-await CT scan in 6 months.  # post thoracotomy pain-clinically stable.  Continue NSAIDs.  #Air leak noted on previous chest x-ray; awaiting chest x-ray from today.  Will inform patient of  # DISPOSITION: # Follow up in 6 months- MD; CT chest prior-Dr.B

## 2020-02-11 ENCOUNTER — Telehealth: Payer: Self-pay | Admitting: Internal Medicine

## 2020-02-11 NOTE — Telephone Encounter (Signed)
On 10/18-spoke to patient regarding the stable results of chest x-ray.  Will inform Dr. Otilio Carpen any further recommendations.

## 2020-02-12 ENCOUNTER — Encounter: Payer: Self-pay | Admitting: *Deleted

## 2020-02-18 ENCOUNTER — Other Ambulatory Visit: Payer: Self-pay

## 2020-02-18 DIAGNOSIS — R918 Other nonspecific abnormal finding of lung field: Secondary | ICD-10-CM

## 2020-02-20 LAB — EXTERNAL GENERIC LAB PROCEDURE: COLOGUARD: NEGATIVE

## 2020-02-20 LAB — COLOGUARD: COLOGUARD: NEGATIVE

## 2020-02-24 ENCOUNTER — Ambulatory Visit (INDEPENDENT_AMBULATORY_CARE_PROVIDER_SITE_OTHER): Payer: 59 | Admitting: Cardiothoracic Surgery

## 2020-02-24 ENCOUNTER — Ambulatory Visit
Admission: RE | Admit: 2020-02-24 | Discharge: 2020-02-24 | Disposition: A | Discharge status: Home / Self Care | Payer: 59 | Source: Ambulatory Visit | Attending: Cardiothoracic Surgery | Admitting: Cardiothoracic Surgery

## 2020-02-24 ENCOUNTER — Ambulatory Visit
Admission: RE | Admit: 2020-02-24 | Discharge: 2020-02-24 | Disposition: A | Discharge status: Home / Self Care | Payer: 59 | Attending: Cardiothoracic Surgery | Admitting: Cardiothoracic Surgery

## 2020-02-24 ENCOUNTER — Encounter: Payer: Self-pay | Admitting: Cardiothoracic Surgery

## 2020-02-24 ENCOUNTER — Other Ambulatory Visit: Payer: Self-pay

## 2020-02-24 VITALS — BP 136/76 | HR 102 | Temp 98.3°F | Resp 12 | Ht 61.0 in | Wt 130.0 lb

## 2020-02-24 DIAGNOSIS — R918 Other nonspecific abnormal finding of lung field: Secondary | ICD-10-CM | POA: Insufficient documentation

## 2020-02-24 NOTE — Progress Notes (Signed)
Tabitha Knight comes in for follow-up.  She is now about 5 weeks out from her left upper lobectomy.  She has been doing very well.  She did see Dr. Rogue Bussing and he is recommended observation only.  She does have some discomfort underneath her left breast.  She describes this more as a tingling sensation.  She has occasional pain in her back but this is relieved with Tylenol and Advil.  She is not short of breath.  Her lungs are clear bilaterally.  They are perhaps slightly diminished at the left base.  Her heart is regular.  Her thoracotomy wound is healing as expected.  I have independently reviewed her chest x-ray from today.  I see no complicating features.  I would like to see her back again in 2 months.  All of her questions were answered.  She was instructed to obtain some lighted Derm for her incision to see if that would help.  She was also given some postoperative instructions for rehabilitation.

## 2020-02-24 NOTE — Patient Instructions (Addendum)
Please continue to walk the wall with your left arm. You may use Lidoderm patches.These can be purchased at [facility] or any drug store. You may resume driving. You may gradually lift things now. If it hurts stop and then try lifting again in a few days or a week. We will reach out to you in January. We will notify you in December to schedule your January appointment.

## 2020-02-25 NOTE — Progress Notes (Signed)
  Oncology Nurse Navigator Documentation  Navigator Location: [facility] (02/25/20 1300)   )Navigator Encounter Type: Appt/Treatment Plan Review (02/25/20 1300)     Confirmed Diagnosis Date: 01/23/20 (02/25/20 1300)             Treatment Initiated Date: 01/20/20 (02/25/20 1300) Patient Visit Type: MedOnc (02/25/20 1300) Treatment Phase: Survivorship (02/25/20 1300) Barriers/Navigation Needs: No Barriers At This Time;No Needs;No Questions (02/25/20 1300)   Interventions: None Required (02/25/20 1300)                      Time Spent with Patient: 30 (02/25/20 1300)

## 2020-05-28 ENCOUNTER — Ambulatory Visit
Admission: RE | Admit: 2020-05-28 | Discharge: 2020-05-28 | Disposition: A | Discharge status: Home / Self Care | Payer: 59 | Source: Ambulatory Visit | Attending: Cardiothoracic Surgery | Admitting: Cardiothoracic Surgery

## 2020-05-28 ENCOUNTER — Other Ambulatory Visit: Payer: Self-pay

## 2020-05-28 DIAGNOSIS — R918 Other nonspecific abnormal finding of lung field: Secondary | ICD-10-CM

## 2020-05-29 ENCOUNTER — Ambulatory Visit (INDEPENDENT_AMBULATORY_CARE_PROVIDER_SITE_OTHER): Payer: 59 | Admitting: Surgery

## 2020-05-29 ENCOUNTER — Encounter: Payer: Self-pay | Admitting: Surgery

## 2020-05-29 ENCOUNTER — Other Ambulatory Visit: Payer: Self-pay

## 2020-05-29 VITALS — BP 154/91 | HR 80 | Temp 99.0°F | Ht 61.0 in | Wt 134.4 lb

## 2020-05-29 DIAGNOSIS — C3412 Malignant neoplasm of upper lobe, left bronchus or lung: Secondary | ICD-10-CM | POA: Diagnosis not present

## 2020-05-29 DIAGNOSIS — Z09 Encounter for follow-up examination after completed treatment for conditions other than malignant neoplasm: Secondary | ICD-10-CM

## 2020-05-29 NOTE — Progress Notes (Signed)
[facility] SUR[facility]ICAL CLINIC NOTE  05/29/2020  History of Present Illness: Tabitha Knight is a 65 y.o. female known to our service following left thoracotomy and wedge resection of left upper lobe with completion lobectomy for adenocarcinoma on 09/27 with Dr Genevive Bi. She presents to the clinic today for 4 months follow up. She reports that she is overall doing well. She is having very intermittent soreness with raising her left arm and some swelling over her chest tube sites. Overall, this is very mild and is not requiring anything for pain management. She denied any fever, chills, nausea, emesis, CP, or SOB. She continues to intermittently use her incentive spirometer at home. She had follow up CXR yesterday which looks good. She has plans for surveillance CT in April through Dr Rogue Bussing.   Past Medical History: Past Medical History:  Diagnosis Date  . Anxiety   . Arthritis   . History of hiatal hernia   . HLD (hyperlipidemia)   . Osteoporosis   . Psoriasis   . Sinus congestion      Past Surgical History: Past Surgical History:  Procedure Laterality Date  . ANTERIOR CERVICAL DECOMP/DISCECTOMY FUSION N/A 03/13/2019   Procedure: ANTERIOR CERVICAL DECOMPRESSION/DISCECTOMY FUSION 2 LEVELS C5-7;  Surgeon: Meade Maw, MD;  Location: [facility] ORS;  Service: Neurosurgery;  Laterality: N/A;  . APPENDECTOMY    . CHOLECYSTECTOMY    . EXPLORATORY LAPAROTOMY  1988   removed appendix  . [place]  . NOVASURE ABLATION    . THORACOTOMY/LOBECTOMY Left 01/20/2020   Procedure: THORACOTOMY/LOBECTOMY;  Surgeon: Nestor Lewandowsky, MD;  Location: [facility] ORS;  Service: Thoracic;  Laterality: Left;    Home Medications: Prior to Admission medications   Medication Sig Start Date End Date Taking? Authorizing Provider  ALPRAZolam (XANAX) 0.25 MG tablet Take 1 tablet (0.25 mg total) by mouth 2 (two) times daily as needed. for anxiety Patient taking differently: Take 0.25 mg by  mouth at bedtime. for anxiety 04/03/19   Philip Aspen, CNM  aspirin EC 81 MG tablet Take 81 mg by mouth daily.    [provider]  atorvastatin (LIPITOR) 80 MG tablet Take 80 mg by mouth daily at 6 PM.     [provider]  calcium carbonate (TUMS - DOSED IN MG ELEMENTAL CALCIUM) 500 MG chewable tablet Chew 1-2 tablets by mouth daily as needed for indigestion or heartburn.    [provider]  cetirizine (ZYRTEC) 10 MG tablet Take 10 mg by mouth daily after breakfast.     [provider]  Cholecalciferol (VITAMIN D) 50 MCG (2000 UT) tablet Take 2,000 Units by mouth daily with lunch.     [provider]  clobetasol ointment (TEMOVATE) 1.94 % Apply 1 application topically 2 (two) times daily as needed (psoriasis).     [provider]  fluticasone (FLONASE) 50 MCG/ACT nasal spray Place 2 sprays into both nostrils daily as needed.     [provider]  montelukast (SINGULAIR) 10 MG tablet Take 10 mg by mouth at bedtime.    [provider]  Propylene Glycol (SYSTANE COMPLETE) 0.6 % SOLN Apply 1 drop to eye daily as needed. 1 gtt each eye as needed for dry eyes    [provider]  Sodium Chloride-Sodium Bicarb (NETI POT SINUS [place] NA) Place 1 Dose into the nose daily as needed (congestion).    [provider]    Allergies: Allergies  Allergen Reactions  . Meperidine Hcl     Unknown  reaction  . Propoxyphene Hcl     Unknown reaction    Review of Systems: Review of Systems  Constitutional: Negative for chills and fever.  HENT: Negative for congestion and sore throat.   Respiratory: Negative for cough and shortness of breath.   Cardiovascular: Negative for chest pain and palpitations.  Gastrointestinal: Negative for abdominal pain, nausea and vomiting.  All other systems reviewed and are negative.   Physical Exam BP (!) 154/91   Pulse 80   Temp 99 F (37.2 C) (Oral)   Ht 5\' 1"  (1.549 m)   Wt 134 lb  6.4 oz (61 kg)   SpO2 95%   BMI 25.39 kg/m   Physical Exam Vitals and nursing note reviewed. Exam conducted with a chaperone present.  Constitutional:      General: She is not in acute distress.    Appearance: Normal appearance. She is normal weight. She is not ill-appearing.  HENT:     Head: Normocephalic and atraumatic.  Eyes:     General: No scleral icterus.    Conjunctiva/sclera: Conjunctivae normal.  Cardiovascular:     Rate and Rhythm: Normal rate and regular rhythm.     Pulses: Normal pulses.     Heart sounds: No murmur heard.   Pulmonary:     Effort: Pulmonary effort is normal. No respiratory distress.     Breath sounds: Normal breath sounds.  Chest:     Comments: Thoracotomy incision to the left lateral upper chest has healed well. Chest tube sites to the left lateral lower chest are healed well, there is some induration present Genitourinary:    Comments: Defered Musculoskeletal:     Right lower leg: No edema.     Left lower leg: No edema.  Skin:    General: Skin is warm and dry.     Coloration: Skin is not pale.     Findings: No erythema.  Neurological:     General: No focal deficit present.     Mental Status: She is alert and oriented to person, place, and time.  Psychiatric:        Mood and Affect: Mood normal.        Behavior: Behavior normal.     Labs/Imaging:  CXR (05/28/2020) personally reviewed which is unremarkable, and radiologist report reviewed:  IMPRESSION: No active cardiopulmonary disease.   Assessment and Plan: This is a 65 y.o. female 4 months s/p left thoracotomy and completion left upper lobectomy for adenocarcinoma   - She is doing well   - Pain control prn  - Incentive spirometer as needed  - We will plan to follow up with her in ~3 months after completion of surveillance CT with oncology   Face-to-face time spent with the patient and care providers was 20 minutes, with more than 50% of the time spent counseling, educating,  and coordinating care of the patient.     Edison Simon, PA-C [facility] 05/29/2020, 10:11 AM [phone] M-F: 7am - 4pm

## 2020-05-29 NOTE — Patient Instructions (Signed)
You can take motrin and tylenol and use ice packs as needed. You can go ahead and have your mammogram. If you have any concerns or questions, please feel free to call our office.

## 2020-07-06 DIAGNOSIS — C3411 Malignant neoplasm of upper lobe, right bronchus or lung: Secondary | ICD-10-CM | POA: Insufficient documentation

## 2020-08-06 ENCOUNTER — Telehealth: Payer: Self-pay | Admitting: Cardiothoracic Surgery

## 2020-08-06 NOTE — Telephone Encounter (Signed)
Spoke with patient and let her know that only the CT was needed and she does not need any more imaging.

## 2020-08-06 NOTE — Telephone Encounter (Signed)
Pt called to clarify the appt that was originally sch'd w/Dr. Genevive Bi & has now been resch'd w/Dr. Hampton Abbot on 08/28/20, as she needed to move the arrival time to 11 am, due to conflicting appt's.  In the meantime, she wanted to verify if another CXR was needed prior to this appt, even though she is scheduled for a follow up CT on 08/11/20, to f/up w/oncology (Dr. Rogue Bussing) on 08/12/20.  Please advise by calling back.  Thank you

## 2020-08-11 ENCOUNTER — Ambulatory Visit
Admission: RE | Admit: 2020-08-11 | Discharge: 2020-08-11 | Disposition: A | Discharge status: Home / Self Care | Payer: 59 | Source: Ambulatory Visit | Attending: Internal Medicine | Admitting: Internal Medicine

## 2020-08-11 ENCOUNTER — Other Ambulatory Visit: Payer: Self-pay

## 2020-08-11 DIAGNOSIS — C3412 Malignant neoplasm of upper lobe, left bronchus or lung: Secondary | ICD-10-CM | POA: Insufficient documentation

## 2020-08-11 LAB — POCT I-STAT CREATININE: Creatinine, Ser: 0.5 mg/dL (ref 0.44–1.00)

## 2020-08-11 MED ORDER — IOHEXOL 300 MG/ML  SOLN
75.0000 mL | Freq: Once | INTRAMUSCULAR | Status: AC | PRN
  Administered 2020-08-11: 75 mL via INTRAVENOUS

## 2020-08-12 ENCOUNTER — Inpatient Hospital Stay: Payer: 59 | Attending: Internal Medicine | Admitting: Internal Medicine

## 2020-08-12 ENCOUNTER — Other Ambulatory Visit: Payer: Self-pay | Admitting: *Deleted

## 2020-08-12 ENCOUNTER — Encounter: Payer: Self-pay | Admitting: Internal Medicine

## 2020-08-12 VITALS — BP 146/73 | HR 82 | Temp 98.1°F | Resp 16 | Ht 61.0 in | Wt 134.0 lb

## 2020-08-12 DIAGNOSIS — R911 Solitary pulmonary nodule: Secondary | ICD-10-CM

## 2020-08-12 DIAGNOSIS — E785 Hyperlipidemia, unspecified: Secondary | ICD-10-CM | POA: Insufficient documentation

## 2020-08-12 DIAGNOSIS — C3412 Malignant neoplasm of upper lobe, left bronchus or lung: Secondary | ICD-10-CM | POA: Insufficient documentation

## 2020-08-12 DIAGNOSIS — K760 Fatty (change of) liver, not elsewhere classified: Secondary | ICD-10-CM | POA: Diagnosis not present

## 2020-08-12 DIAGNOSIS — Z79899 Other long term (current) drug therapy: Secondary | ICD-10-CM | POA: Insufficient documentation

## 2020-08-12 NOTE — Progress Notes (Signed)
Having some swelling in neck area on both sides x1 month. Not causing any pain or tenderness.

## 2020-08-12 NOTE — Progress Notes (Signed)
[place] NOTE  Patient Care Team: Kirk Ruths, MD as PCP - General (Internal Medicine) Telford Nab, RN as Oncology Nurse Navigator Cammie Sickle, MD as Consulting Physician (Internal Medicine)  CHIEF COMPLAINTS/PURPOSE OF CONSULTATION: lung cancer  Oncology History Overview Note   #SEP 2021- Left upper lobe nodule- 1.3 x 1.8 x 2.1 cm-spiculated abutting major fissure-highly concerning for primary lung cancer; 7 mm triangular subpleural nodule-question benign; SEP 27th 2021- s/p LOBECTOMY- pT1cN0- STAGE I- adenocarcinoma- Surveillaince  #Smoking-10 years [quit August 2021]; neck surgery [November 2020 Dr. 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  Visceral Pleura Invasion: Not identified  Lymphovascular Invasion: Not identified  Direct Invasion of Adjacent Structures: No adjacent structures present  Margins: All margins uninvolved by tumor  Margins examined: Bronchial, vascular, and parenchymal  Regional Lymph Nodes: Number of Lymph Nodes Involved: 0  Number of Lymph Nodes Examined: 5   Treatment Effect: No known presurgical therapy  Pathologic Stage Classification (pTNM, AJCC 8th Edition): pT1c pN0   # SURVIVORSHIP:   # GENETICS:   DIAGNOSIS: Lung cancer  STAGE:  I       ;  GOALS: cure  CURRENT/MOST RECENT THERAPY : surveillaince    Primary cancer of left upper lobe of lung ([place])  02/10/2020 Initial Diagnosis   Primary cancer of left upper lobe of lung ([facility])      HISTORY OF PRESENTING ILLNESS:  Tabitha Knight 65 y.o.  female stage I lung cancer left upper lobe s/p surgery is here to review the results of her surveillance imaging.  Patient denies any  worsening pain at the site of surgery.  However notes to have bilateral fullness in the supraclavicular region left more than right.  Nontender.  No masses felt.  Noted in the last month or so.  No nausea no vomiting.  No headaches.   Review of Systems  Constitutional: Negative for chills, diaphoresis, fever, malaise/fatigue and weight loss.  HENT: Negative for nosebleeds and sore throat.   Eyes: Negative for double vision.  Respiratory: Negative for cough, hemoptysis, sputum production, shortness of breath and wheezing.   Cardiovascular: Negative for chest pain, palpitations, orthopnea and leg swelling.  Gastrointestinal: Negative for abdominal pain, blood in stool, constipation, diarrhea, heartburn, melena, nausea and vomiting.  Genitourinary: Negative for dysuria, frequency and urgency.  Musculoskeletal: Negative for back pain and joint pain.  Skin: Negative.  Negative for itching and rash.  Neurological: Negative for dizziness, tingling, focal weakness, weakness and headaches.  Endo/Heme/Allergies: Does not bruise/bleed easily.  Psychiatric/Behavioral: Negative for depression. The patient is not nervous/anxious and does not have insomnia.      MEDICAL HISTORY:  Past Medical History:  Diagnosis Date  . Anxiety   . Arthritis   . History of hiatal hernia   . HLD (hyperlipidemia)   . Osteoporosis   . Psoriasis   . Sinus congestion     SURGICAL HISTORY: Past Surgical History:  Procedure Laterality Date  . ANTERIOR CERVICAL DECOMP/DISCECTOMY FUSION N/A 03/13/2019   Procedure: ANTERIOR CERVICAL DECOMPRESSION/DISCECTOMY FUSION 2 LEVELS C5-7;  Surgeon: Meade Maw, MD;  Location: [facility] ORS;  Service:  Neurosurgery;  Laterality: N/A;  . APPENDECTOMY    . CHOLECYSTECTOMY    . EXPLORATORY LAPAROTOMY  1988   removed appendix  . [place]  . NOVASURE ABLATION    . THORACOTOMY/LOBECTOMY Left 01/20/2020   Procedure: THORACOTOMY/LOBECTOMY;  Surgeon: Nestor Lewandowsky,  MD;  Location: [facility] ORS;  Service: Thoracic;  Laterality: Left;    SOCIAL HISTORY: Social History   Socioeconomic History  . Marital status: Married    Spouse name: Not on file  . Number of children: Not on file  . Years of education: Not on file  . Highest education level: Not on file  Occupational History  . Not on file  Tobacco Use  . Smoking status: Former Smoker    Packs/day: 0.25    Types: Cigarettes  . Smokeless tobacco: Never Used  Vaping Use  . Vaping Use: Never used  Substance and Sexual Activity  . Alcohol use: Yes  . Drug use: No  . Sexual activity: Never  Other Topics Concern  . Not on file  Social History Narrative   Quit smoking- Dec 08, 2019- [started in 50s]; light beer/ wine daily; insurance retd. Lives in husband; lives in [place].    Social Determinants of Health   Financial Resource Strain: Not on file  Food Insecurity: Not on file  Transportation Needs: Not on file  Physical Activity: Not on file  Stress: Not on file  Social Connections: Not on file  Intimate Partner Violence: Not on file    FAMILY HISTORY: Family History  Problem Relation Age of Onset  . Breast cancer Paternal Aunt   . Hypertension Mother   . Hyperlipidemia Mother   . Cirrhosis Mother   . Heart disease Father   . Diabetes Father   . Stroke Father     ALLERGIES:  is allergic to meperidine hcl and propoxyphene hcl.  MEDICATIONS:  Current Outpatient Medications  Medication Sig Dispense Refill  . ALPRAZolam (XANAX) 0.25 MG tablet Take 1 tablet (0.25 mg total) by mouth 2 (two) times daily as needed. for anxiety (Patient taking differently: Take 0.25 mg by mouth at bedtime. for anxiety) 45 tablet 4  . aspirin EC 81 MG tablet Take 81 mg by mouth daily.    Marland Kitchen atorvastatin (LIPITOR) 80 MG tablet Take 80 mg by mouth daily at 6 PM.     . calcium carbonate (TUMS - DOSED IN MG ELEMENTAL CALCIUM) 500 MG chewable tablet Chew 1-2 tablets by mouth daily as needed for indigestion or  heartburn.    . cetirizine (ZYRTEC) 10 MG tablet Take 10 mg by mouth daily after breakfast.     . Cholecalciferol (VITAMIN D) 50 MCG (2000 UT) tablet Take 2,000 Units by mouth daily with lunch.     . clobetasol ointment (TEMOVATE) 5.42 % Apply 1 application topically 2 (two) times daily as needed (psoriasis).     . fluticasone (FLONASE) 50 MCG/ACT nasal spray Place 2 sprays into both nostrils daily as needed.     . montelukast (SINGULAIR) 10 MG tablet Take 10 mg by mouth at bedtime.    Marland Kitchen Propylene Glycol (SYSTANE COMPLETE) 0.6 % SOLN Apply 1 drop to eye daily as needed. 1 gtt each eye as needed for dry eyes    . Sodium Chloride-Sodium Bicarb (NETI POT SINUS [place] NA) Place 1 Dose into the nose daily as needed (congestion).     No current facility-administered medications for this visit.      Marland Kitchen  PHYSICAL EXAMINATION: ECOG PERFORMANCE  STATUS: 0 - Asymptomatic  Vitals:   08/12/20 1318  BP: (!) 146/73  Pulse: 82  Resp: 16  Temp: 98.1 F (36.7 C)  SpO2: 100%   Filed Weights   08/12/20 1318  Weight: 134 lb (60.8 kg)    Physical Exam HENT:     Head: Normocephalic and atraumatic.     Mouth/Throat:     Pharynx: No oropharyngeal exudate.  Eyes:     Pupils: Pupils are equal, round, and reactive to light.  Cardiovascular:     Rate and Rhythm: Normal rate and regular rhythm.  Pulmonary:     Effort: No respiratory distress.     Breath sounds: No wheezing.  Abdominal:     General: Bowel sounds are normal. There is no distension.     Palpations: Abdomen is soft. There is no mass.     Tenderness: There is no abdominal tenderness. There is no guarding or rebound.  Musculoskeletal:        General: No tenderness. Normal range of motion.     Cervical back: Normal range of motion and neck supple.  Skin:    General: Skin is warm.  Neurological:     Mental Status: She is alert and oriented to person, place, and time.  Psychiatric:        Mood and Affect: Affect normal.       LABORATORY DATA:  I have reviewed the data as listed Lab Results  Component Value Date   WBC 7.9 01/28/2020   HGB 12.9 01/28/2020   HCT 36.7 01/28/2020   MCV 90.4 01/28/2020   PLT 330 01/28/2020   Recent Labs    12/25/19 1020 01/16/20 0958 01/21/20 0644 01/28/20 0821 08/11/20 1129  NA  --  140 139 138  --   K  --  4.0 3.7 3.5  --   CL  --  102 102 101  --   CO2  --  29 27 27   --   GLUCOSE  --  94 134* 134*  --   BUN 8 7* <5* 7*  --   CREATININE 0.48 0.41* 0.36* 0.44 0.50  CALCIUM  --  9.1 9.0 8.8*  --   GFRNONAA >60 >60 >60 >60  --   GFRAA >60 >60 >60  --   --   PROT  --  7.4  --   --   --   ALBUMIN  --  4.6  --   --   --   AST  --  42*  --   --   --   ALT  --  42  --   --   --   ALKPHOS  --  77  --   --   --   BILITOT  --  1.0  --   --   --     RADIOGRAPHIC STUDIES: I have personally reviewed the radiological images as listed and agreed with the findings in the report. CT CHEST W CONTRAST  Result Date: 08/11/2020 CLINICAL DATA:  Left lung cancer, status post left upper lobectomy, former smoker EXAM: CT CHEST WITH CONTRAST TECHNIQUE: Multidetector CT imaging of the chest was performed during intravenous contrast administration. CONTRAST:  63mL OMNIPAQUE IOHEXOL 300 MG/ML  SOLN COMPARISON:  PET-CT, 01/09/2020, CT chest, 12/25/2019 FINDINGS: Cardiovascular: No significant vascular findings. Normal heart size. No pericardial effusion. Mediastinum/Nodes: No enlarged mediastinal, hilar, or axillary lymph nodes. Small hiatal hernia. Thyroid gland, trachea, and esophagus demonstrate no significant findings. Lungs/Pleura: Status post interval  left upper lobectomy. Stable, benign irregular subpleural scarring of the peripheral lateral segment right middle lobe (series 4, image 86). No pleural effusion or pneumothorax. Upper Abdomen: No acute abnormality. Status post cholecystectomy. Hepatic steatosis. Musculoskeletal: No chest wall mass or suspicious bone lesions identified.  IMPRESSION: 1. Status post interval left upper lobectomy. No evidence of recurrent or metastatic disease in the chest. 2. Stable, benign irregular subpleural scarring of the peripheral lateral segment right middle lobe. 3. Small hiatal hernia. 4. Hepatic steatosis. Electronically Signed   By: Eddie Candle M.D.   On: 08/11/2020 16:18    ASSESSMENT & PLAN:   Primary cancer of left upper lobe of lung ([place]) #Left upper lobe lung cancer-adenocarcinoma; stage I.  [sep 1829]; no adjuvant therapy.  April 2022-CT scan no evidence of any recurrent/metastatic disease.  Stable benign subpleural scarring right middle lobe.   #Recommend continued surveillance imaging with CT scan every 6 months for the first 3 years; and then annually for total of 5 years.  #Bilateral neck fullness/supraclavicular left more than right-otherwise asymptomatic.  No masses felt.  CT scan no vascular thrombosis.  Recommend monitor for now; if not improved/worse-would recommend further imaging with CT scan of the neck.  Patient will call.  # Fatty liver- ? Alcohol vs. Hyperlipidemia-discussed regarding risk of cirrhosis etc.  Recommend alcohol cessation/control of hyperlipidemia.  # DISPOSITION: # Follow up in 6 months- MD;labs- cbc/cmp CT chest prior-Dr.B  # I reviewed the blood work- with the patient in detail; also reviewed the imaging independently [as summarized above]; and with the patient in detail.     All questions were answered. The patient knows to call the clinic with any problems, questions or concerns.    Cammie Sickle, MD 08/12/2020 2:12 PM

## 2020-08-12 NOTE — Assessment & Plan Note (Addendum)
#  Left upper lobe lung cancer-adenocarcinoma; stage I.  [sep 9584]; no adjuvant therapy.  April 2022-CT scan no evidence of any recurrent/metastatic disease.  Stable benign subpleural scarring right middle lobe.   #Recommend continued surveillance imaging with CT scan every 6 months for the first 3 years; and then annually for total of 5 years.  #Bilateral neck fullness/supraclavicular left more than right-otherwise asymptomatic.  No masses felt.  CT scan no vascular thrombosis.  Recommend monitor for now; if not improved/worse-would recommend further imaging with CT scan of the neck.  Patient will call.  # Fatty liver- ? Alcohol vs. Hyperlipidemia-discussed regarding risk of cirrhosis etc.  Recommend alcohol cessation/control of hyperlipidemia.  # DISPOSITION: # Follow up in 6 months- MD;labs- cbc/cmp CT chest prior-Dr.B  # I reviewed the blood work- with the patient in detail; also reviewed the imaging independently [as summarized above]; and with the patient in detail.

## 2020-08-28 ENCOUNTER — Encounter: Payer: Self-pay | Admitting: Surgery

## 2020-08-28 ENCOUNTER — Other Ambulatory Visit: Payer: Self-pay

## 2020-08-28 ENCOUNTER — Ambulatory Visit (INDEPENDENT_AMBULATORY_CARE_PROVIDER_SITE_OTHER): Payer: 59 | Admitting: Surgery

## 2020-08-28 VITALS — BP 145/85 | HR 90 | Temp 98.3°F | Ht 61.0 in | Wt 133.0 lb

## 2020-08-28 DIAGNOSIS — D170 Benign lipomatous neoplasm of skin and subcutaneous tissue of head, face and neck: Secondary | ICD-10-CM

## 2020-08-28 DIAGNOSIS — C3412 Malignant neoplasm of upper lobe, left bronchus or lung: Secondary | ICD-10-CM

## 2020-08-28 NOTE — Progress Notes (Signed)
08/28/2020  History of Present Illness: Tabitha Knight is a 65 y.o. female s/p left thoracotomy with left upper lobectomy for lung cancer with Dr. Genevive Bi on 01/20/20.  She's Stage Ia. She presents today for follow up.  She's been doing well.  Reports only some discomfort around the thoracotomy incision depending on how she's moving, but she's able to go golfing without major issues.  Denies any breathing difficulty.  Does report that most recently she's having some congestion due to seasonal allergies.  She's also reporting that she's recently noticed an area of swelling/fullness in the supraclavicular area on both sides, with left being larger than right.  She reports that when she wakes up, she doesn't notice them much, but as the day goes one, she notices the swelling on both side.  The area is not tender at all and has not noticed any redness, drainage, or induration.  Of note, she had a CT scan of chest last month which I have personally viewed.  No evidence of any cancer recurrence.  Past Medical History: Past Medical History:  Diagnosis Date  . Anxiety   . Arthritis   . History of hiatal hernia   . HLD (hyperlipidemia)   . Osteoporosis   . Psoriasis   . Sinus congestion      Past Surgical History: Past Surgical History:  Procedure Laterality Date  . ANTERIOR CERVICAL DECOMP/DISCECTOMY FUSION N/A 03/13/2019   Procedure: ANTERIOR CERVICAL DECOMPRESSION/DISCECTOMY FUSION 2 LEVELS C5-7;  Surgeon: Meade Maw, MD;  Location: [facility] ORS;  Service: Neurosurgery;  Laterality: N/A;  . APPENDECTOMY    . CHOLECYSTECTOMY    . EXPLORATORY LAPAROTOMY  1988   removed appendix  . [place]  . NOVASURE ABLATION    . THORACOTOMY/LOBECTOMY Left 01/20/2020   Procedure: THORACOTOMY/LOBECTOMY;  Surgeon: Nestor Lewandowsky, MD;  Location: [facility] ORS;  Service: Thoracic;  Laterality: Left;    Home Medications: Prior to Admission medications   Medication Sig Start Date End Date  Taking? Authorizing Provider  ALPRAZolam (XANAX) 0.25 MG tablet Take 1 tablet (0.25 mg total) by mouth 2 (two) times daily as needed. for anxiety Patient taking differently: Take 0.25 mg by mouth at bedtime. for anxiety 04/03/19  Yes Philip Aspen, CNM  aspirin EC 81 MG tablet Take 81 mg by mouth daily.   Yes [provider]  atorvastatin (LIPITOR) 80 MG tablet Take 80 mg by mouth daily at 6 PM.    Yes [provider]  calcium carbonate (TUMS - DOSED IN MG ELEMENTAL CALCIUM) 500 MG chewable tablet Chew 1-2 tablets by mouth daily as needed for indigestion or heartburn.   Yes [provider]  cetirizine (ZYRTEC) 10 MG tablet Take 10 mg by mouth daily after breakfast.    Yes [provider]  Cholecalciferol (VITAMIN D) 50 MCG (2000 UT) tablet Take 2,000 Units by mouth daily with lunch.    Yes [provider]  clobetasol ointment (TEMOVATE) 0.10 % Apply 1 application topically 2 (two) times daily as needed (psoriasis).    Yes [provider]  fluticasone (FLONASE) 50 MCG/ACT nasal spray Place 2 sprays into both nostrils daily as needed.    Yes [provider]  montelukast (SINGULAIR) 10 MG tablet Take 10 mg by mouth at bedtime.   Yes [provider]  Propylene Glycol (SYSTANE COMPLETE) 0.6 % SOLN Apply 1 drop to eye daily as needed. 1 gtt each eye as needed for dry eyes   Yes [provider]  Sodium Chloride-Sodium Bicarb (NETI POT SINUS WASH NA) Place 1 Dose into the nose daily as needed (congestion).   Yes [provider]    Allergies: Allergies  Allergen Reactions  . Meperidine Hcl     Unknown reaction  . Propoxyphene Hcl     Unknown reaction    Review of Systems: Review of Systems  Constitutional: Negative for chills, fever, malaise/fatigue and weight loss.  Respiratory: Negative for shortness of breath.   Cardiovascular: Negative for chest pain.  Gastrointestinal: Negative for abdominal pain,  nausea and vomiting.  Skin:       Fullness/swelling of bilateral supraclavicular areas  Endo/Heme/Allergies: Positive for environmental allergies.    Physical Exam BP (!) 145/85   Pulse 90   Temp 98.3 F (36.8 C)   Ht 5\' 1"  (1.549 m)   Wt 133 lb (60.3 kg)   SpO2 98%   BMI 25.13 kg/m  CONSTITUTIONAL: No acute distress HEENT:  Normocephalic, atraumatic, extraocular motion intact. RESPIRATORY:  Lungs are clear, and breath sounds are equal bilaterally. Normal respiratory effort without pathologic use of accessory muscles. CARDIOVASCULAR: Heart is regular without murmurs, gallops, or rubs. SKIN:  The patient has on the left supraclavicular medial area, a soft, mobile mass that is about 3 cm in size, which changes in protrusion based on movement of her left arm.  When her arm is lowered, the mass sinks in, when the arm is raised, the mass protrudes more.  Similarly, the patient has the same on the right supraclavicular area, with a 2 cm mass instead.  Both are soft, non-tender, and mobile.  This is consistent with lipomas. NEUROLOGIC:  Motor and sensation is grossly normal.  Cranial nerves are grossly intact. PSYCH:  Alert and oriented to person, place and time. Affect is normal.  Labs/Imaging: CT Chest 08/11/20: IMPRESSION: 1. Status post interval left upper lobectomy. No evidence of recurrent or metastatic disease in the chest. 2. Stable, benign irregular subpleural scarring of the peripheral lateral segment right middle lobe. 3. Small hiatal hernia. 4. Hepatic steatosis.   Assessment and Plan: This is a 65 y.o. female s/p left thoracotomy and left upper lobectomy for lung cancer, Stage Ia  --Discussed with the patient that from the cancer standpoint and surgery standpoint, she's doing very well.  Her CT scan last month did not show any mass and no evidence of recurrence or metastasis.  She's playing golf, has good energy, and only has some discomfort which I believe is related to  scarring depending on how she moves. --The masses that she's reporting in the supraclavicular areas are both soft, non-tender, and mobile.  I do not think these are lymph nodes as they are no visible on her CT scan, but rather likely to be lipomas.  These move and protrude more or less depending on how her arm is located as the clavicle moves as well.  Discussed with her what lipomas are, that these are benign masses.  There is no urgency for surgery, and mostly surgery is driven by patient symptoms.  I suggested that we could order U/S of the supraclavicular areas to evaluate better, as CT scan is not ideal to look for lipomas.  For now, she'll monitor the areas closely and if they appear to get bigger, will call us for further testing. --Otherwise, follow up with me or Dr. Genevive Bi in October 2022 after her surveillance CT scan.  Face-to-face time spent with the patient and care providers was 25 minutes, with more than 50%  of the time spent counseling, educating, and coordinating care of the patient.     Melvyn Neth, [facility]

## 2020-08-28 NOTE — Patient Instructions (Addendum)
We recommend that you watch these areas.   If they persist or start to bother you then you can call us and we can get you set up for an ultrasound of the areas on your upper shoulders/neck area.   Follow up here in October after your Cat Scan. We will send you a letter about this appointment.   Lipoma  A lipoma is a noncancerous (benign) tumor that is made up of fat cells. This is a very common type of soft-tissue growth. Lipomas are usually found under the skin (subcutaneous). They may occur in any tissue of the body that contains fat. Common areas for lipomas to appear include the back, arms, shoulders, buttocks, and thighs. Lipomas grow slowly, and they are usually painless. Most lipomas do not cause problems and do not require treatment. What are the causes? The cause of this condition is not known. What increases the risk? You are more likely to develop this condition if:  You are 2-56 years old.  You have a family history of lipomas. What are the signs or symptoms? A lipoma usually appears as a small, round bump under the skin. In most cases, the lump will:  Feel soft or rubbery.  Not cause pain or other symptoms. However, if a lipoma is located in an area where it pushes on nerves, it can become painful or cause other symptoms. How is this diagnosed? A lipoma can usually be diagnosed with a physical exam. You may also have tests to confirm the diagnosis and to rule out other conditions. Tests may include:  Imaging tests, such as a CT scan or an MRI.  Removal of a tissue sample to be looked at under a microscope (biopsy). How is this treated? Treatment for this condition depends on the size of the lipoma and whether it is causing any symptoms.  For small lipomas that are not causing problems, no treatment is needed.  If a lipoma is bigger or it causes problems, surgery may be done to remove the lipoma. Lipomas can also be removed to improve appearance. Most often, the  procedure is done after applying a medicine that numbs the area (local anesthetic).  Liposuction may be done to reduce the size of the lipoma before it is removed through surgery, or it may be done to remove the lipoma. Lipomas are removed with this method in order to limit incision size and scarring. A liposuction tube is inserted through a small incision into the lipoma, and the contents of the lipoma are removed through the tube with suction. Follow these instructions at home:  Watch your lipoma for any changes.  Keep all follow-up visits as told by your health care provider. This is important. Contact a health care provider if:  Your lipoma becomes larger or hard.  Your lipoma becomes painful, red, or increasingly swollen. These could be signs of infection or a more serious condition. Get help right away if:  You develop tingling or numbness in an area near the lipoma. This could indicate that the lipoma is causing nerve damage. Summary  A lipoma is a noncancerous tumor that is made up of fat cells.  Most lipomas do not cause problems and do not require treatment.  If a lipoma is bigger or it causes problems, surgery may be done to remove the lipoma.  Contact a health care provider if your lipoma becomes larger or hard, or if it becomes painful, red, or increasingly swollen. Pain, redness, and swelling could be signs of  infection or a more serious condition. This information is not intended to replace advice given to you by your health care provider. Make sure you discuss any questions you have with your health care provider. Document Revised: 11/26/2018 Document Reviewed: 11/26/2018 Elsevier Patient Education  [place].

## 2020-11-23 IMAGING — MR MRI CERVICAL SPINE WITHOUT CONTRAST
5 series · 39 of 48 positions shown · non-contrast
Comparison: 04/11/2008

CLINICAL DATA: Posterior neck pain radiating to the right shoulder,
arm and hand with numbness and tingling.

EXAM:
MRI CERVICAL SPINE WITHOUT CONTRAST
TECHNIQUE: Multiplanar, multisequence MR imaging of the cervical spine was
performed. No intravenous contrast was administered.

[Series 5: T2 · sagittal · 3.0mm · 0.62mm/px · 6 of 15 slices shown (1 of 2)]
[im 1/15]
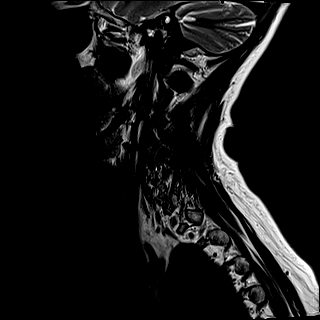
[im 3/15]
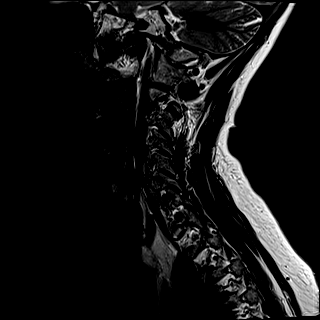
[im 6/15]
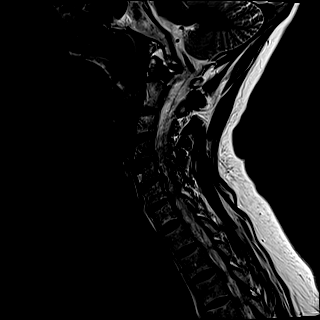
[im 9/15]
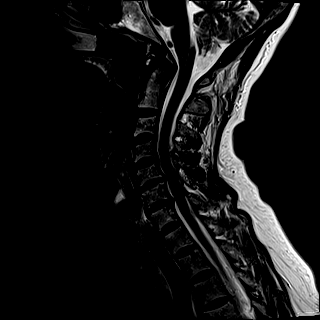
[im 12/15]
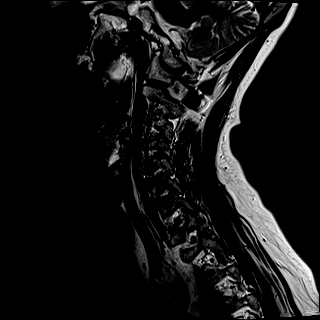
[im 15/15]
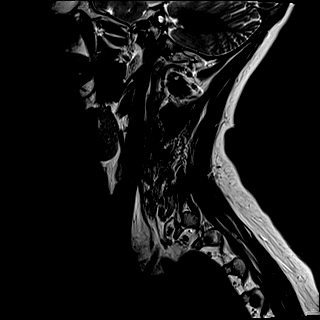

[Series 6: FLAIR · sagittal · 3.0mm · 0.78mm/px · 7 of 15 slices shown]
[im 1/15]
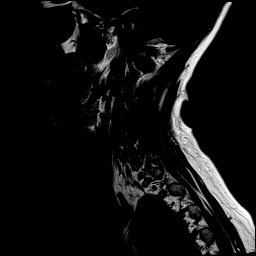
[im 3/15]
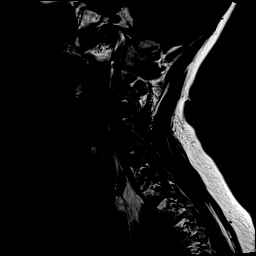
[im 5/15]
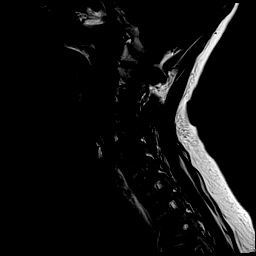
[im 8/15]
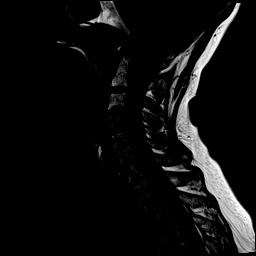
[im 10/15]
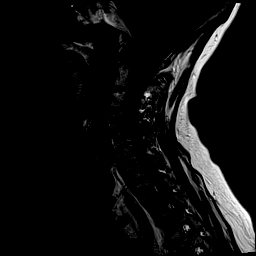
[im 12/15]
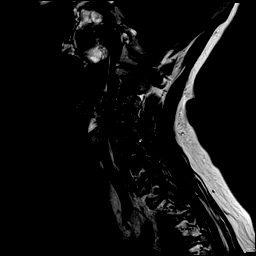
[im 15/15]
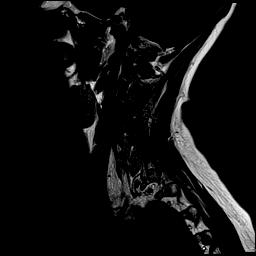

[Series 7: STIR · sagittal · 3.0mm · 0.62mm/px · 7 of 15 slices shown]
[im 1/15]
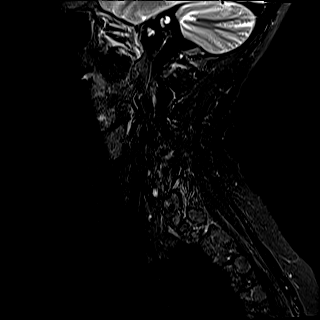
[im 3/15]
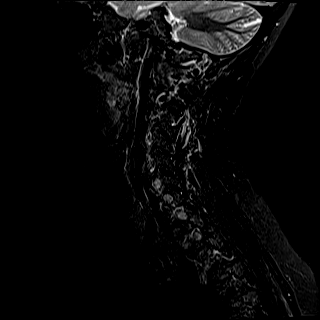
[im 5/15]
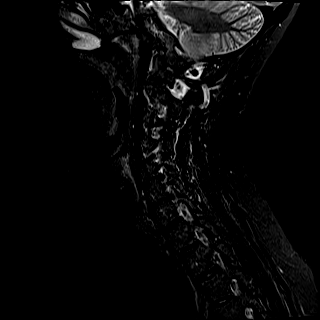
[im 8/15]
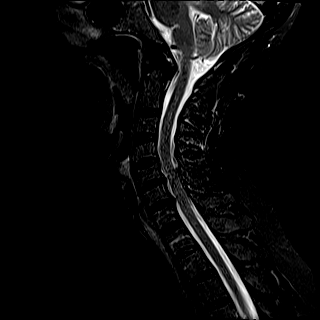
[im 10/15]
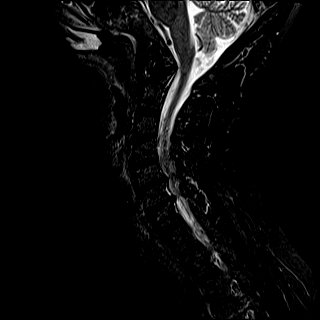
[im 12/15]
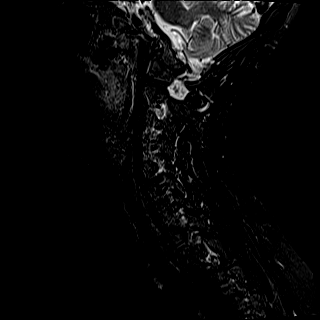
[im 15/15]
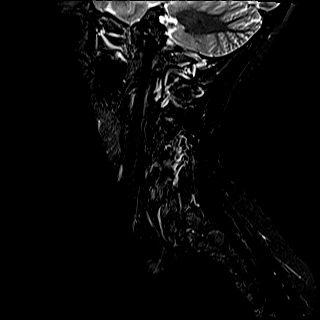

[Series 8: T2 · axial · 3.0mm · 0.70mm/px · z∈[-95,+2]mm · 11 of 29 slices shown (2 of 2)]
[im 1/29]
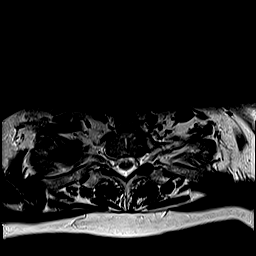
[im 3/29]
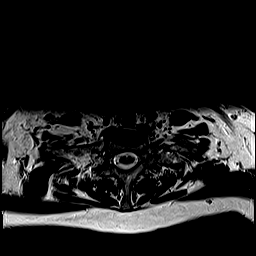
[im 5/29]
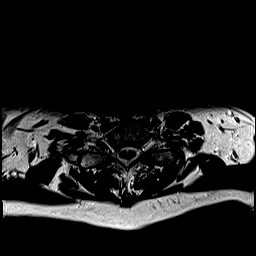
[im 7/29]
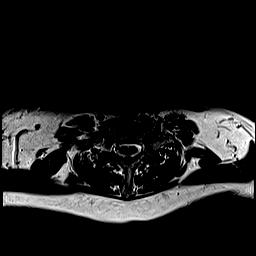
[im 9/29]
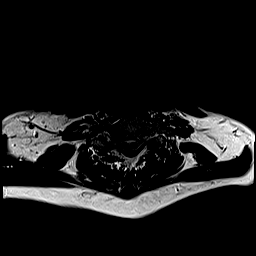
[im 11/29]
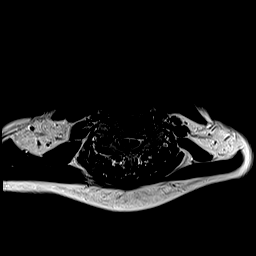
[im 13/29]
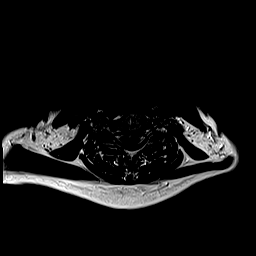
[im 16/29]
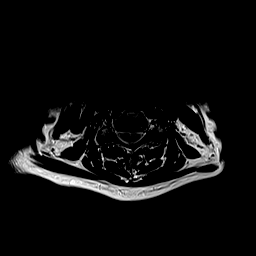
[im 20/29]
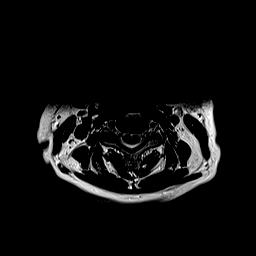
[im 24/29]
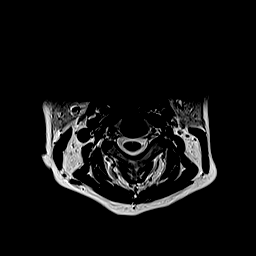
[im 29/29]
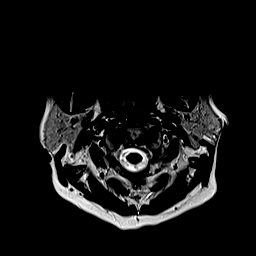

[Series 9: ax mpgr · axial · 3.0mm · 0.35mm/px · z∈[-95,+2]mm · 8 of 29 slices shown]
[im 1/29]
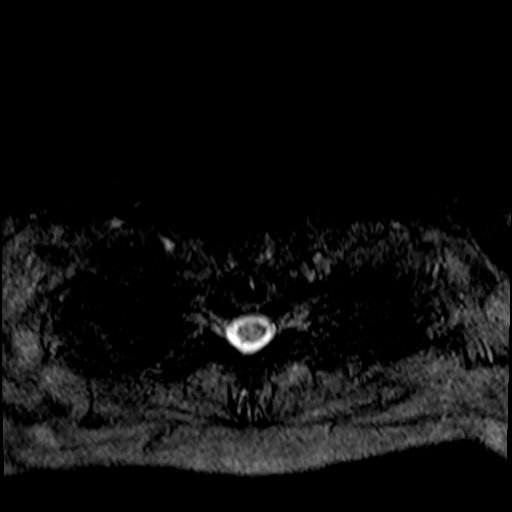
[im 5/29]
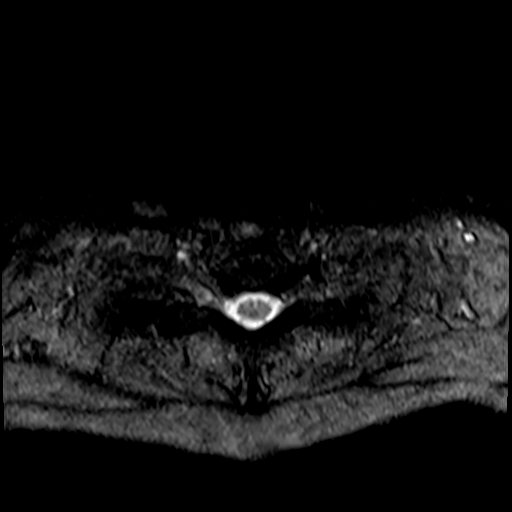
[im 9/29]
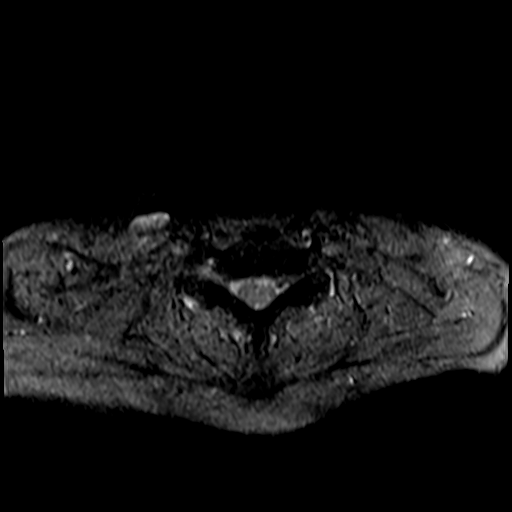
[im 13/29]
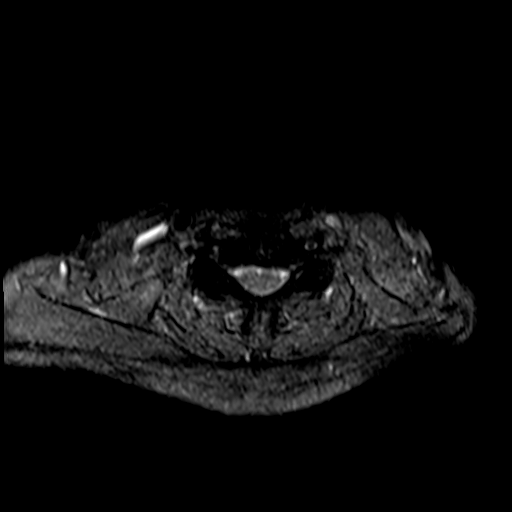
[im 16/29]
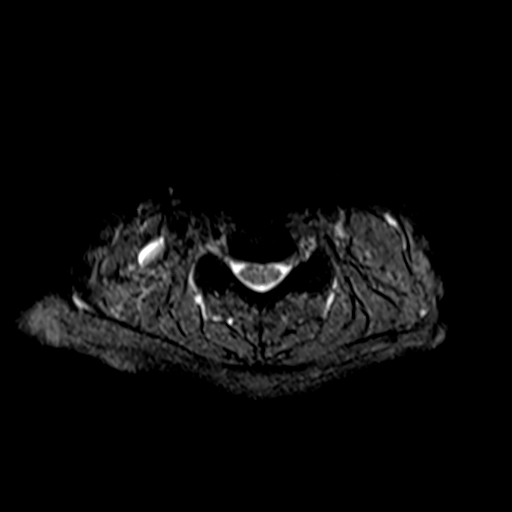
[im 20/29]
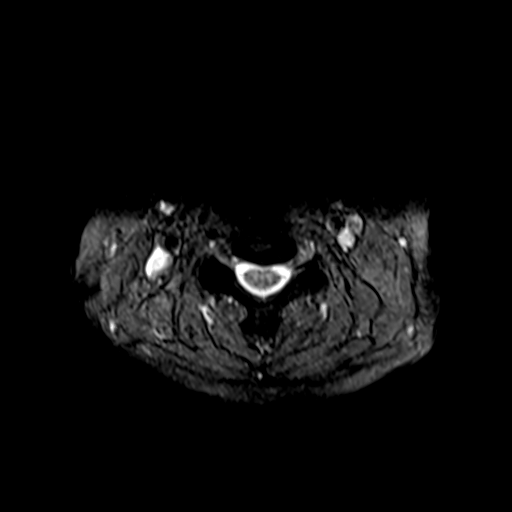
[im 24/29]
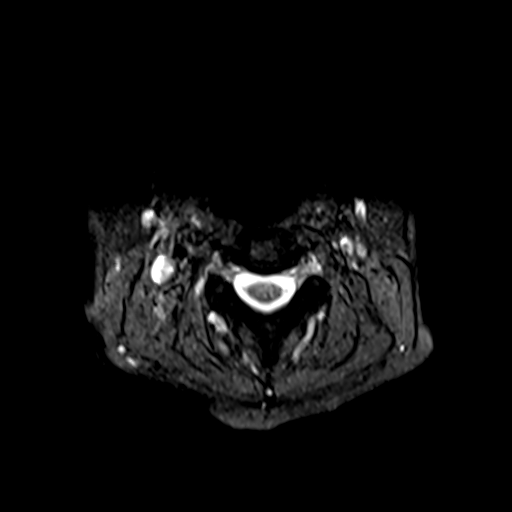
[im 29/29]
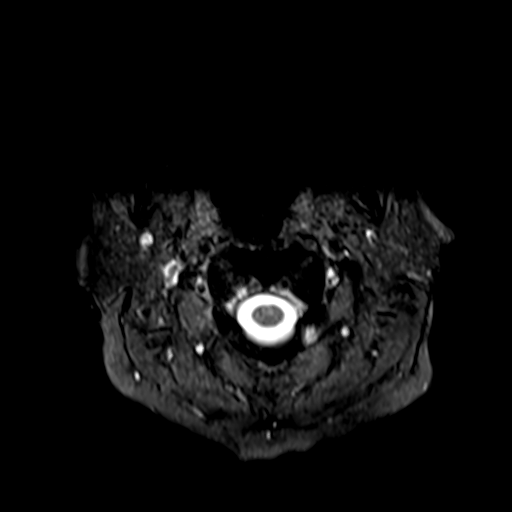

[39 of 48 positions shown; findings below may reference images not displayed]

FINDINGS: Alignment: Normal

Vertebrae: Normal

Cord: No cord compression or primary cord lesion.

Posterior Fossa, vertebral arteries, paraspinal tissues: Normal

Disc levels:

No abnormality at the foramen magnum, C1-2, C2-3 or C3-4.

C4-5.  Minimal disc bulge.  No canal or foraminal stenosis.

C5-6: Spondylosis with endplate osteophytes and bulging of the disc.
Narrowing of the ventral subarachnoid space but no compression of
the cord. AP diameter of the canal 8 mm at this level. Bilateral
foraminal narrowing left more than right. Either C6 nerve could be
affected, more likely the left.

C6-7: Spondylosis with endplate osteophytes and bulging of the disc.
Narrowing of the ventral subarachnoid space but no compression of
the cord. AP diameter of the canal 9 mm. Bilateral foraminal
narrowing that could affect either C7 nerve.

C7-T1: Normal interspace.
IMPRESSION: Degenerative spondylosis at C5-6 and C6-7 with bilateral foraminal
narrowing that could affect either C6 or C7 nerve. Foraminal
narrowing slightly worse on the left at C5-6 and fairly symmetric at
C6-7.

Compared to the examination of 3111, the left foraminal encroachment
at C5-6 appears fairly similar, but the right-sided foraminal
narrowing at C5-6 and bilateral foraminal narrowing at C6-7 has
progressed.

## 2021-01-28 ENCOUNTER — Other Ambulatory Visit: Payer: Self-pay | Admitting: Internal Medicine

## 2021-01-28 DIAGNOSIS — Z1231 Encounter for screening mammogram for malignant neoplasm of breast: Secondary | ICD-10-CM

## 2021-02-11 ENCOUNTER — Ambulatory Visit
Admission: RE | Admit: 2021-02-11 | Discharge: 2021-02-11 | Disposition: A | Discharge status: Home / Self Care | Payer: 59 | Source: Ambulatory Visit | Attending: Internal Medicine | Admitting: Internal Medicine

## 2021-02-11 ENCOUNTER — Other Ambulatory Visit: Payer: Self-pay

## 2021-02-11 DIAGNOSIS — C3412 Malignant neoplasm of upper lobe, left bronchus or lung: Secondary | ICD-10-CM | POA: Diagnosis present

## 2021-02-11 LAB — POCT I-STAT CREATININE: Creatinine, Ser: 0.6 mg/dL (ref 0.44–1.00)

## 2021-02-11 MED ORDER — IOHEXOL 300 MG/ML  SOLN
75.0000 mL | Freq: Once | INTRAMUSCULAR | Status: AC | PRN
  Administered 2021-02-11: 75 mL via INTRAVENOUS

## 2021-02-12 ENCOUNTER — Inpatient Hospital Stay: Payer: 59 | Attending: Internal Medicine

## 2021-02-12 ENCOUNTER — Encounter: Payer: Self-pay | Admitting: Internal Medicine

## 2021-02-12 ENCOUNTER — Other Ambulatory Visit: Payer: Self-pay

## 2021-02-12 ENCOUNTER — Inpatient Hospital Stay (HOSPITAL_BASED_OUTPATIENT_CLINIC_OR_DEPARTMENT_OTHER): Payer: 59 | Admitting: Internal Medicine

## 2021-02-12 VITALS — BP 134/79 | HR 93 | Resp 16 | Wt 136.2 lb

## 2021-02-12 DIAGNOSIS — Z87891 Personal history of nicotine dependence: Secondary | ICD-10-CM | POA: Diagnosis not present

## 2021-02-12 DIAGNOSIS — C3412 Malignant neoplasm of upper lobe, left bronchus or lung: Secondary | ICD-10-CM | POA: Diagnosis present

## 2021-02-12 DIAGNOSIS — K760 Fatty (change of) liver, not elsewhere classified: Secondary | ICD-10-CM | POA: Diagnosis not present

## 2021-02-12 DIAGNOSIS — Z902 Acquired absence of lung [part of]: Secondary | ICD-10-CM | POA: Diagnosis not present

## 2021-02-12 LAB — CBC WITH DIFFERENTIAL/PLATELET
Abs Immature Granulocytes: 0.02 10*3/uL (ref 0.00–0.07)
Basophils Absolute: 0 10*3/uL (ref 0.0–0.1)
Basophils Relative: 1 %
Eosinophils Absolute: 0.2 10*3/uL (ref 0.0–0.5)
Eosinophils Relative: 3 %
HCT: 39.6 % (ref 36.0–46.0)
Hemoglobin: 13.7 g/dL (ref 12.0–15.0)
Immature Granulocytes: 0 %
Lymphocytes Relative: 25 %
Lymphs Abs: 1.4 10*3/uL (ref 0.7–4.0)
MCH: 31.4 pg (ref 26.0–34.0)
MCHC: 34.6 g/dL (ref 30.0–36.0)
MCV: 90.8 fL (ref 80.0–100.0)
Monocytes Absolute: 0.6 10*3/uL (ref 0.1–1.0)
Monocytes Relative: 10 %
Neutro Abs: 3.5 10*3/uL (ref 1.7–7.7)
Neutrophils Relative %: 61 %
Platelets: 185 10*3/uL (ref 150–400)
RBC: 4.36 MIL/uL (ref 3.87–5.11)
RDW: 11.5 % (ref 11.5–15.5)
WBC: 5.7 10*3/uL (ref 4.0–10.5)
nRBC: 0 % (ref 0.0–0.2)

## 2021-02-12 LAB — COMPREHENSIVE METABOLIC PANEL
ALT: 29 U/L (ref 0–44)
AST: 31 U/L (ref 15–41)
Albumin: 4.4 g/dL (ref 3.5–5.0)
Alkaline Phosphatase: 80 U/L (ref 38–126)
Anion gap: 7 (ref 5–15)
BUN: 10 mg/dL (ref 8–23)
CO2: 30 mmol/L (ref 22–32)
Calcium: 9.1 mg/dL (ref 8.9–10.3)
Chloride: 101 mmol/L (ref 98–111)
Creatinine, Ser: 0.6 mg/dL (ref 0.44–1.00)
GFR, Estimated: >60 mL/min (ref >60)
Glucose, Bld: 133 mg/dL — ABNORMAL HIGH (ref 70–99)
Potassium: 4.3 mmol/L (ref 3.5–5.1)
Sodium: 138 mmol/L (ref 135–145)
Total Bilirubin: 0.7 mg/dL (ref 0.3–1.2)
Total Protein: 7.3 g/dL (ref 6.5–8.1)

## 2021-02-12 NOTE — Assessment & Plan Note (Addendum)
#  Left upper lobe lung cancer-adenocarcinoma; stage I.  [sep 7225]; no adjuvant therapy.  OCT 2022-CT scan no evidence of any recurrent/metastatic disease.  Stable benign subpleural scarring right middle lobe.  CT scan ordered-for next visit.  #Bilateral neck fullness/supraclavicular left more than right-otherwise asymptomatic.  No masses felt.    # Fatty liver- ? Alcohol vs. Hyperlipidemia-discussed regarding risk of cirrhosis etc.  Recommend alcohol cessation/control of hyperlipidemia.  Also discussed about weight loss.  # DISPOSITION: # Follow up in 6 months- MD;labs- cbc/cmp CT chest prior-Dr.B  # I reviewed the blood work- with the patient in detail; also reviewed the imaging independently [as summarized above]; and with the patient in detail.

## 2021-02-12 NOTE — Progress Notes (Signed)
Tabitha Knight NOTE  Patient Care Team: Kirk Ruths, MD as PCP - General (Internal Medicine) Telford Nab, RN as Oncology Nurse Navigator Cammie Sickle, MD as Consulting Physician (Internal Medicine)  CHIEF COMPLAINTS/PURPOSE OF CONSULTATION: lung cancer  Oncology History Overview Note   #SEP 2021- Left upper lobe nodule- 1.3 x 1.8 x 2.1 cm-spiculated abutting major fissure-highly concerning for primary lung cancer; 7 mm triangular subpleural nodule-question benign; SEP 27th 2021- s/p LOBECTOMY- pT1cN0- STAGE I- adenocarcinoma- Surveillaince  #Smoking-10 years [quit August 2021]; neck surgery [November 2020 Dr. 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  Visceral Pleura Invasion: Not identified  Lymphovascular Invasion: Not identified  Direct Invasion of Adjacent Structures: No adjacent structures present  Margins: All margins uninvolved by tumor  Margins examined: Bronchial, vascular, and parenchymal  Regional Lymph Nodes: Number of Lymph Nodes Involved: 0  Number of Lymph Nodes Examined: 5   Treatment Effect: No known presurgical therapy  Pathologic Stage Classification (pTNM, AJCC 8th Edition): pT1c pN0   # SURVIVORSHIP:   # GENETICS:   DIAGNOSIS: Lung cancer  STAGE:  I       ;  GOALS: cure  CURRENT/MOST RECENT THERAPY : surveillaince    Primary cancer of left upper lobe of lung ([place])  02/10/2020 Initial Diagnosis   Primary cancer of left upper lobe of lung ([place])      HISTORY OF PRESENTING ILLNESS: Ambulating independently accompanied by her husband.  Tabitha Knight 65 y.o.  female stage I lung cancer left upper lobe s/p surgery is here to review the results of  her surveillance  CT imaging.  Denies any pain.  Any shortness of breath or cough.   She continues to have bilateral fullness in the supraclavicular region left more than right.  Nontender.  No masses felt.   No nausea no vomiting.  No headaches.   Review of Systems  Constitutional:  Negative for chills, diaphoresis, fever, malaise/fatigue and weight loss.  HENT:  Negative for nosebleeds and sore throat.   Eyes:  Negative for double vision.  Respiratory:  Negative for cough, hemoptysis, sputum production, shortness of breath and wheezing.   Cardiovascular:  Negative for chest pain, palpitations, orthopnea and leg swelling.  Gastrointestinal:  Negative for abdominal pain, blood in stool, constipation, diarrhea, heartburn, melena, nausea and vomiting.  Genitourinary:  Negative for dysuria, frequency and urgency.  Musculoskeletal:  Negative for back pain and joint pain.  Skin: Negative.  Negative for itching and rash.  Neurological:  Negative for dizziness, tingling, focal weakness, weakness and headaches.  Endo/Heme/Allergies:  Does not bruise/bleed easily.  Psychiatric/Behavioral:  Negative for depression. The patient is not nervous/anxious and does not have insomnia.     MEDICAL HISTORY:  Past Medical History:  Diagnosis Date   Anxiety    Arthritis    History of hiatal hernia    HLD (hyperlipidemia)    Osteoporosis    Psoriasis    Sinus congestion     SURGICAL HISTORY: Past Surgical History:  Procedure Laterality Date   ANTERIOR CERVICAL DECOMP/DISCECTOMY FUSION N/A 03/13/2019   Procedure: ANTERIOR CERVICAL DECOMPRESSION/DISCECTOMY FUSION 2 LEVELS  C5-7;  Surgeon: Meade Maw, MD;  Location: [facility];  Service: Neurosurgery;  Laterality: N/A;   APPENDECTOMY     CHOLECYSTECTOMY     EXPLORATORY LAPAROTOMY  1988   removed appendix   GALLBLADDER SURGERY  1993   NOVASURE ABLATION     THORACOTOMY/LOBECTOMY Left 01/20/2020   Procedure: THORACOTOMY/LOBECTOMY;  Surgeon: Nestor Lewandowsky, MD;  Location: [facility];  Service: Thoracic;  Laterality: Left;    SOCIAL HISTORY: Social History   Socioeconomic History   Marital status: Married    Spouse name: Not on file   Number of children: Not on file   Years of education: Not on file   Highest education level: Not on file  Occupational History   Not on file  Tobacco Use   Smoking status: Former    Packs/day: 0.25    Types: Cigarettes   Smokeless tobacco: Never  Vaping Use   Vaping Use: Never used  Substance and Sexual Activity   Alcohol use: Yes   Drug use: No   Sexual activity: Never  Other Topics Concern   Not on file  Social History Narrative   Quit smoking- Dec 08, 2019- [started in 50s]; light beer/ wine daily; insurance retd. Lives in husband; lives in [place].    Social Determinants of Health   Financial Resource Strain: Not on file  Food Insecurity: Not on file  Transportation Needs: Not on file  Physical Activity: Not on file  Stress: Not on file  Social Connections: Not on file  Intimate Partner Violence: Not on file    FAMILY HISTORY: Family History  Problem Relation Age of Onset   Breast cancer Paternal Aunt    Hypertension Mother    Hyperlipidemia Mother    Cirrhosis Mother    Heart disease Father    Diabetes Father    Stroke Father     ALLERGIES:  is allergic to meperidine hcl and propoxyphene hcl.  MEDICATIONS:  Current Outpatient Medications  Medication Sig Dispense Refill   ALPRAZolam (XANAX) 0.25 MG tablet Take 1 tablet (0.25 mg total) by mouth 2 (two) times daily as needed. for anxiety (Patient taking differently: Take 0.25 mg by mouth at bedtime. for anxiety) 45 tablet 4   aspirin EC 81 MG tablet Take 81 mg by mouth daily.     atorvastatin (LIPITOR) 80 MG tablet Take 80 mg by mouth daily at 6 PM.      calcium carbonate (TUMS - DOSED IN MG ELEMENTAL CALCIUM) 500 MG chewable tablet Chew 1-2 tablets by mouth daily as needed for indigestion or heartburn.      cetirizine (ZYRTEC) 10 MG tablet Take 10 mg by mouth daily after breakfast.      Cholecalciferol (VITAMIN D) 50 MCG (2000 UT) tablet Take 2,000 Units by mouth daily with lunch.      clobetasol ointment (TEMOVATE) 7.78 % Apply 1 application topically 2 (two) times daily as needed (psoriasis).      fluticasone (FLONASE) 50 MCG/ACT nasal spray Place 2 sprays into both nostrils daily as needed.      montelukast (SINGULAIR) 10 MG tablet Take 10 mg by mouth at bedtime.     Propylene Glycol (SYSTANE COMPLETE) 0.6 % SOLN Apply 1 drop to eye daily as needed. 1 gtt each eye as needed for dry eyes     Sodium Chloride-Sodium Bicarb (NETI POT SINUS [place] NA) Place 1 Dose into the nose daily as needed (congestion).     No current facility-administered medications for this visit.      Marland Kitchen  PHYSICAL EXAMINATION: ECOG PERFORMANCE STATUS: 0 - Asymptomatic  Vitals:   02/12/21 1340  BP: 134/79  Pulse: 93  Resp: 16  SpO2: 97%   Filed Weights   02/12/21 1340  Weight: 136 lb 3.2 oz (61.8 kg)    Physical Exam HENT:     Head: Normocephalic and atraumatic.     Mouth/Throat:     Pharynx: No oropharyngeal exudate.  Eyes:     Pupils: Pupils are equal, round, and reactive to light.  Cardiovascular:     Rate and Rhythm: Normal rate and regular rhythm.  Pulmonary:     Effort: No respiratory distress.     Breath sounds: No wheezing.  Abdominal:     General: Bowel sounds are normal. There is no distension.     Palpations: Abdomen is soft. There is no mass.     Tenderness: There is no abdominal tenderness. There is no guarding or rebound.  Musculoskeletal:        General: No tenderness. Normal range of motion.     Cervical back: Normal range of motion and neck supple.  Skin:    General: Skin is warm.  Neurological:     Mental Status: She is alert and oriented to person, place, and time.  Psychiatric:        Mood and Affect: Affect normal.     LABORATORY DATA:  I have reviewed the data as  listed Lab Results  Component Value Date   WBC 5.7 02/12/2021   HGB 13.7 02/12/2021   HCT 39.6 02/12/2021   MCV 90.8 02/12/2021   PLT 185 02/12/2021   Recent Labs    08/11/20 1129 02/11/21 0956 02/12/21 1258  NA  --   --  138  K  --   --  4.3  CL  --   --  101  CO2  --   --  30  GLUCOSE  --   --  133*  BUN  --   --  10  CREATININE 0.50 0.60 0.60  CALCIUM  --   --  9.1  GFRNONAA  --   --  >60  PROT  --   --  7.3  ALBUMIN  --   --  4.4  AST  --   --  31  ALT  --   --  29  ALKPHOS  --   --  80  BILITOT  --   --  0.7    RADIOGRAPHIC STUDIES: I have personally reviewed the radiological images as listed and agreed with the findings in the report. CT CHEST W CONTRAST  Result Date: 02/12/2021 CLINICAL DATA:  A 65 year old female presents with non-small cell lung cancer history for follow-up. History of LEFT upper lobectomy in 2021. EXAM: CT CHEST WITH CONTRAST TECHNIQUE: Multidetector CT imaging of the chest was performed during intravenous contrast administration. CONTRAST:  47mL OMNIPAQUE IOHEXOL 300 MG/ML  SOLN COMPARISON:  August 11, 2020 FINDINGS: Cardiovascular: Normal caliber of the thoracic aorta. Normal heart size. Small pericardial effusion is unchanged compared to previous imaging. Central pulmonary vasculature is unremarkable. Mediastinum/Nodes: Small hiatal hernia. Mildly patulous esophagus. No thoracic inlet, axillary, mediastinal or hilar lymphadenopathy. Lungs/Pleura: Postoperative changes related to LEFT upper lobectomy. Small nodular area along the minor fissure in the RIGHT chest is stable at approximately 4 mm (image 71/3), likely scarring. LEFT lower lobe is clear aside from minimal scarring that is unchanged. No effusion. No consolidative process. Airways are patent. Upper Abdomen: Signs of hepatic steatosis. Post cholecystectomy.  No acute process in the upper abdomen. Normal adrenal glands. Musculoskeletal: No acute musculoskeletal process. No destructive bone  finding. IMPRESSION: Postoperative changes related to LEFT upper lobectomy. No evidence of recurrent or metastatic disease. Stable nodular area in the RIGHT chest along the RIGHT lateral middle lobe near the minor fissure likely related to scarring. Signs of hepatic steatosis. Small hiatal hernia. Electronically Signed   By: Zetta Bills M.D.   On: 02/12/2021 16:16     ASSESSMENT & PLAN:   Primary cancer of left upper lobe of lung ([facility]) #Left upper lobe lung cancer-adenocarcinoma; stage I.  [sep 9702]; no adjuvant therapy.  OCT 2022-CT scan no evidence of any recurrent/metastatic disease.  Stable benign subpleural scarring right middle lobe.  CT scan ordered-for next visit.  #Bilateral neck fullness/supraclavicular left more than right-otherwise asymptomatic.  No masses felt.    # Fatty liver- ? Alcohol vs. Hyperlipidemia-discussed regarding risk of cirrhosis etc.  Recommend alcohol cessation/control of hyperlipidemia.  Also discussed about weight loss.  # DISPOSITION: # Follow up in 6 months- MD;labs- cbc/cmp CT chest prior-Dr.B  # I reviewed the blood work- with the patient in detail; also reviewed the imaging independently [as summarized above]; and with the patient in detail.     All questions were answered. The patient knows to call the clinic with any problems, questions or concerns.    Cammie Sickle, MD 02/12/2021 4:37 PM

## 2021-02-12 NOTE — Progress Notes (Signed)
Pt has no concerns or complaints at this time. Just wanting to know results of scan

## 2021-02-17 ENCOUNTER — Ambulatory Visit
Admission: RE | Admit: 2021-02-17 | Discharge: 2021-02-17 | Disposition: A | Discharge status: Home / Self Care | Payer: 59 | Source: Ambulatory Visit | Attending: Internal Medicine | Admitting: Internal Medicine

## 2021-02-17 ENCOUNTER — Other Ambulatory Visit: Payer: Self-pay

## 2021-02-17 DIAGNOSIS — Z1231 Encounter for screening mammogram for malignant neoplasm of breast: Secondary | ICD-10-CM | POA: Diagnosis not present

## 2021-02-26 ENCOUNTER — Encounter: Payer: Self-pay | Admitting: Surgery

## 2021-02-26 ENCOUNTER — Other Ambulatory Visit: Payer: Self-pay

## 2021-02-26 ENCOUNTER — Ambulatory Visit (INDEPENDENT_AMBULATORY_CARE_PROVIDER_SITE_OTHER): Payer: 59 | Admitting: Surgery

## 2021-02-26 VITALS — BP 148/88 | HR 80 | Temp 98.3°F | Ht 61.0 in | Wt 135.8 lb

## 2021-02-26 DIAGNOSIS — C3412 Malignant neoplasm of upper lobe, left bronchus or lung: Secondary | ICD-10-CM

## 2021-02-26 NOTE — Progress Notes (Signed)
02/26/2021  History of Present Illness: Tabitha Knight is a 65 y.o. female s/p left thoracotomy with left upper lobectomy for lung cancer with Dr. Genevive Bi on 01/20/20.  Pathology showed stage Ia.  She presents today for follow-up.  She recently had a follow-up with Dr. Rogue Bussing on 02/12/2021 and had a CT of her chest on 02/11/2021 for surveillance.  CT did not show any evidence of recurrence.  I personally viewed the images and agree with the findings.  Patient reports that she has been doing very well, has gained a few pounds, and is actively playing golf without any issues.  Denies any troubles with the left thoracotomy incision or any discomfort.  Past Medical History: Past Medical History:  Diagnosis Date   Anxiety    Arthritis    History of hiatal hernia    HLD (hyperlipidemia)    Osteoporosis    Psoriasis    Sinus congestion      Past Surgical History: Past Surgical History:  Procedure Laterality Date   ANTERIOR CERVICAL DECOMP/DISCECTOMY FUSION N/A 03/13/2019   Procedure: ANTERIOR CERVICAL DECOMPRESSION/DISCECTOMY FUSION 2 LEVELS C5-7;  Surgeon: Meade Maw, MD;  Location: [facility];  Service: Neurosurgery;  Laterality: N/A;   APPENDECTOMY     CHOLECYSTECTOMY     EXPLORATORY LAPAROTOMY  1988   removed appendix   GALLBLADDER SURGERY  1993   NOVASURE ABLATION     THORACOTOMY/LOBECTOMY Left 01/20/2020   Procedure: THORACOTOMY/LOBECTOMY;  Surgeon: Nestor Lewandowsky, MD;  Location: [facility];  Service: Thoracic;  Laterality: Left;    Home Medications: Prior to Admission medications   Medication Sig Start Date End Date Taking? Authorizing Provider  ALPRAZolam (XANAX) 0.25 MG tablet Take 1 tablet (0.25 mg total) by mouth 2 (two) times daily as needed. for anxiety Patient taking differently: Take 0.25 mg by mouth at bedtime. for anxiety 04/03/19  Yes Philip Aspen, CNM  aspirin EC 81 MG tablet Take 81 mg by mouth daily.   Yes [provider]  atorvastatin (LIPITOR) 80  MG tablet Take 80 mg by mouth daily at 6 PM.    Yes [provider]  calcium carbonate (TUMS - DOSED IN MG ELEMENTAL CALCIUM) 500 MG chewable tablet Chew 1-2 tablets by mouth daily as needed for indigestion or heartburn.   Yes [provider]  cetirizine (ZYRTEC) 10 MG tablet Take 10 mg by mouth daily after breakfast.    Yes [provider]  Cholecalciferol (VITAMIN D) 50 MCG (2000 UT) tablet Take 2,000 Units by mouth daily with lunch.    Yes [provider]  clobetasol ointment (TEMOVATE) 7.12 % Apply 1 application topically 2 (two) times daily as needed (psoriasis).    Yes [provider]  fluticasone (FLONASE) 50 MCG/ACT nasal spray Place 2 sprays into both nostrils daily as needed.    Yes [provider]  montelukast (SINGULAIR) 10 MG tablet Take 10 mg by mouth at bedtime.   Yes [provider]  Propylene Glycol (SYSTANE COMPLETE) 0.6 % SOLN Apply 1 drop to eye daily as needed. 1 gtt each eye as needed for dry eyes   Yes [provider]  Sodium Chloride-Sodium Bicarb (NETI POT SINUS [place] NA) Place 1 Dose into the nose daily as needed (congestion).   Yes [provider]    Allergies: Allergies  Allergen Reactions   Meperidine Hcl     Unknown reaction   Propoxyphene Hcl     Unknown reaction    Review of Systems: Review of Systems  Constitutional:  Negative for chills and fever.  Respiratory:  Negative for cough and shortness of breath.   Cardiovascular:  Negative for chest pain.  Gastrointestinal:  Negative for nausea and vomiting.  Skin:  Negative for rash.   Physical Exam BP (!) 148/88   Pulse 80   Temp 98.3 F (36.8 C) (Oral)   Ht 5\' 1"  (1.549 m)   Wt 135 lb 12.8 oz (61.6 kg)   SpO2 99%   BMI 25.66 kg/m  CONSTITUTIONAL: No acute distress, well-nourished HEENT:  Normocephalic, atraumatic, extraocular motion intact. RESPIRATORY:  Lungs are clear, and breath sounds are equal bilaterally. Normal  respiratory effort without pathologic use of accessory muscles. CARDIOVASCULAR: Heart is regular without murmurs, gallops, or rubs. SKIN: Left thoracotomy incision is very well-healed without any dehiscence or hernia.  Left chest tube site is also very well-healed.  No evidence of infection. NEUROLOGIC:  Motor and sensation is grossly normal.  Cranial nerves are grossly intact. PSYCH:  Alert and oriented to person, place and time. Affect is normal.  Labs/Imaging: CT chest 02/11/2021 IMPRESSION: Postoperative changes related to LEFT upper lobectomy. No evidence of recurrent or metastatic disease.   Stable nodular area in the RIGHT chest along the RIGHT lateral middle lobe near the minor fissure likely related to scarring.   Signs of hepatic steatosis.   Small hiatal hernia.   Assessment and Plan: This is a 65 y.o. female status post left thoracotomy with left upper lobectomy.  - Patient is now past 1 year from her surgery and is doing very well without any complications.  She has had 2 CT scans of her chest for surveillance which have shown no evidence of recurrent or metastatic disease.  She remains active. - From surgical standpoint, I think at this point we can defer further follow-ups and imaging to Dr. Rogue Bussing.  She is currently scheduled for repeat CT of her chest in April 2023.  Patient is aware that if any concerns whatsoever any questions that she has, she can most definitely contact us and we will be of any assistance possible. -Follow-up as needed  I spent 25 minutes dedicated to the care of this patient on the date of this encounter to include pre-visit review of records, face-to-face time with the patient discussing diagnosis and management, and any post-visit coordination of care.   Melvyn Neth, [facility]

## 2021-02-26 NOTE — Patient Instructions (Signed)
Please call our office if you have questions or concerns. Please be sure to follow up with Dr.Brahmanday.

## 2021-08-06 ENCOUNTER — Ambulatory Visit
Admission: RE | Admit: 2021-08-06 | Discharge: 2021-08-06 | Disposition: A | Discharge status: Home / Self Care | Payer: 59 | Source: Ambulatory Visit | Attending: Internal Medicine | Admitting: Internal Medicine

## 2021-08-06 DIAGNOSIS — I700 Atherosclerosis of aorta: Secondary | ICD-10-CM | POA: Diagnosis not present

## 2021-08-06 DIAGNOSIS — J439 Emphysema, unspecified: Secondary | ICD-10-CM | POA: Diagnosis not present

## 2021-08-06 DIAGNOSIS — C3490 Malignant neoplasm of unspecified part of unspecified bronchus or lung: Secondary | ICD-10-CM | POA: Diagnosis not present

## 2021-08-06 DIAGNOSIS — C3412 Malignant neoplasm of upper lobe, left bronchus or lung: Secondary | ICD-10-CM | POA: Insufficient documentation

## 2021-08-12 ENCOUNTER — Other Ambulatory Visit: Payer: Self-pay

## 2021-08-12 DIAGNOSIS — C3412 Malignant neoplasm of upper lobe, left bronchus or lung: Secondary | ICD-10-CM

## 2021-08-13 ENCOUNTER — Inpatient Hospital Stay: Payer: 59 | Attending: Internal Medicine

## 2021-08-13 ENCOUNTER — Encounter: Payer: Self-pay | Admitting: Internal Medicine

## 2021-08-13 ENCOUNTER — Inpatient Hospital Stay (HOSPITAL_BASED_OUTPATIENT_CLINIC_OR_DEPARTMENT_OTHER): Payer: 59 | Admitting: Internal Medicine

## 2021-08-13 VITALS — BP 138/83 | HR 92 | Temp 97.5°F | Resp 16 | Ht 61.0 in

## 2021-08-13 DIAGNOSIS — C3412 Malignant neoplasm of upper lobe, left bronchus or lung: Secondary | ICD-10-CM

## 2021-08-13 DIAGNOSIS — Z85118 Personal history of other malignant neoplasm of bronchus and lung: Secondary | ICD-10-CM | POA: Insufficient documentation

## 2021-08-13 DIAGNOSIS — Z87891 Personal history of nicotine dependence: Secondary | ICD-10-CM | POA: Insufficient documentation

## 2021-08-13 DIAGNOSIS — Z902 Acquired absence of lung [part of]: Secondary | ICD-10-CM | POA: Diagnosis not present

## 2021-08-13 LAB — COMPREHENSIVE METABOLIC PANEL
ALT: 33 U/L (ref 0–44)
AST: 34 U/L (ref 15–41)
Albumin: 4.5 g/dL (ref 3.5–5.0)
Alkaline Phosphatase: 84 U/L (ref 38–126)
Anion gap: 5 (ref 5–15)
BUN: 11 mg/dL (ref 8–23)
CO2: 29 mmol/L (ref 22–32)
Calcium: 8.7 mg/dL — ABNORMAL LOW (ref 8.9–10.3)
Chloride: 102 mmol/L (ref 98–111)
Creatinine, Ser: 0.6 mg/dL (ref 0.44–1.00)
GFR, Estimated: >60 mL/min (ref >60)
Glucose, Bld: 121 mg/dL — ABNORMAL HIGH (ref 70–99)
Potassium: 4 mmol/L (ref 3.5–5.1)
Sodium: 136 mmol/L (ref 135–145)
Total Bilirubin: 0.6 mg/dL (ref 0.3–1.2)
Total Protein: 7.2 g/dL (ref 6.5–8.1)

## 2021-08-13 LAB — CBC WITH DIFFERENTIAL/PLATELET
Abs Immature Granulocytes: 0.05 10*3/uL (ref 0.00–0.07)
Basophils Absolute: 0.1 10*3/uL (ref 0.0–0.1)
Basophils Relative: 1 %
Eosinophils Absolute: 0.2 10*3/uL (ref 0.0–0.5)
Eosinophils Relative: 3 %
HCT: 40.6 % (ref 36.0–46.0)
Hemoglobin: 14 g/dL (ref 12.0–15.0)
Immature Granulocytes: 1 %
Lymphocytes Relative: 22 %
Lymphs Abs: 1.6 10*3/uL (ref 0.7–4.0)
MCH: 31.7 pg (ref 26.0–34.0)
MCHC: 34.5 g/dL (ref 30.0–36.0)
MCV: 91.9 fL (ref 80.0–100.0)
Monocytes Absolute: 0.6 10*3/uL (ref 0.1–1.0)
Monocytes Relative: 8 %
Neutro Abs: 4.7 10*3/uL (ref 1.7–7.7)
Neutrophils Relative %: 65 %
Platelets: 211 10*3/uL (ref 150–400)
RBC: 4.42 MIL/uL (ref 3.87–5.11)
RDW: 11.6 % (ref 11.5–15.5)
WBC: 7.2 10*3/uL (ref 4.0–10.5)
nRBC: 0 % (ref 0.0–0.2)

## 2021-08-13 NOTE — Progress Notes (Signed)
Patient denies new problems/concerns today.   °

## 2021-08-13 NOTE — Assessment & Plan Note (Addendum)
#  Left upper lobe lung cancer-adenocarcinoma; stage I.  [sep 2244]; no adjuvant therapy.  April 2023--CT scan no evidence of any recurrent/metastatic disease.  Stable benign subpleural scarring right middle lobe. Will orer CT again today  ? ?#Incidental findings on Imaging CT April 2023, 2023: Mild emphysema; coronary arteriosclerosis/atherosclerosis I reviewed/discussed/counseled the patient.  Recommend follow-up/discussed with PCP regarding further work-up if needed. ? ?# DISPOSITION: ?# Follow up in 6 months- MD;labs- cbc/cmp CT chest prior-Dr.B ? ?# I reviewed the blood work- with the patient in detail; also reviewed the imaging independently [as summarized above]; and with the patient in detail.  ? ?Cc; dr.Anderson.  ? ? ?

## 2021-08-13 NOTE — [place] (Signed)
[place] ?CONSULT NOTE ? ?Patient Care Team: ?Kirk Ruths, MD as PCP - General (Internal Medicine) ?Telford Nab, RN as Sales executive ?Cammie Sickle, MD as Consulting Physician (Internal Medicine) ? ?CHIEF COMPLAINTS/PURPOSE OF CONSULTATION: lung cancer ? ?Oncology History Overview Note  ? ?#SEP 2021- Left upper lobe nodule- 1.3 x 1.8 x 2.1 cm-spiculated abutting major fissure-highly concerning for primary lung cancer; 7 mm triangular subpleural nodule-question benign; SEP 27th 2021- s/p LOBECTOMY- pT1cN0- STAGE I- adenocarcinoma- Surveillaince ? ?#Smoking-10 years [quit August 2021]; neck surgery [November 2020 Dr. Izora Ribas ? ?CANCER CASE SUMMARY: LUNG  ?Procedure: Lobectomy  ?Specimen Laterality: Left  ?Tumor Site: Upper lobe of lung  ?Tumor Size:  ?                     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 ?Treatment Effect: No known presurgical therapy  ?Pathologic Stage Classification (pTNM, AJCC 8th Edition): pT1c pN0  ? ?# SURVIVORSHIP:  ? ?# GENETICS:  ? ?DIAGNOSIS: Lung cancer ? ?STAGE:  I       ;  GOALS: cure ? ?CURRENT/MOST RECENT THERAPY : surveillaince ? ?  ?Primary cancer of left upper lobe of lung ([place])  ?02/10/2020 Initial Diagnosis  ? Primary cancer of left upper lobe of lung ([place]) ?  ? ? ? ?HISTORY OF PRESENTING ILLNESS: Ambulating independently. Alone.  ? ?Tabitha Knight 66 y.o.  female stage I lung cancer left upper lobe s/p surgery is here to review the results of her surveillance  CT  imaging. ? ?Patient denies new problems/concerns today. Denies any pain.  Any shortness of breath or cough.  ? ?She continues to have bilateral fullness in the supraclavicular region left more than right.  Nontender.  No masses felt.  No nausea no vomiting.  No headaches.  ? ?Review of Systems  ?Constitutional:  Negative for chills, diaphoresis, fever, malaise/fatigue and weight loss.  ?HENT:  Negative for nosebleeds and sore throat.   ?Eyes:  Negative for double vision.  ?Respiratory:  Negative for cough, hemoptysis, sputum production, shortness of breath and wheezing.   ?Cardiovascular:  Negative for chest pain, palpitations, orthopnea and leg swelling.  ?Gastrointestinal:  Negative for abdominal pain, blood in stool, constipation, diarrhea, heartburn, melena, nausea and vomiting.  ?Genitourinary:  Negative for dysuria, frequency and urgency.  ?Musculoskeletal:  Negative for back pain and joint pain.  ?Skin: Negative.  Negative for itching and rash.  ?Neurological:  Negative for dizziness, tingling, focal weakness, weakness and headaches.  ?Endo/Heme/Allergies:  Does not bruise/bleed easily.  ?Psychiatric/Behavioral:  Negative for depression. The patient is not nervous/anxious and does not have insomnia.    ? ?MEDICAL HISTORY:  ?Past Medical History:  ?Diagnosis Date  ? Anxiety   ? Arthritis   ? History of hiatal hernia   ? HLD (hyperlipidemia)   ? Osteoporosis   ? Psoriasis   ? Sinus congestion   ? ? ?SURGICAL HISTORY: ?Past Surgical History:  ?Procedure Laterality Date  ? ANTERIOR CERVICAL DECOMP/DISCECTOMY FUSION N/A 03/13/2019  ? Procedure: ANTERIOR CERVICAL DECOMPRESSION/DISCECTOMY FUSION  2 LEVELS C5-7;  Surgeon: Meade Maw, MD;  Location: [facility] ORS;  Service: Neurosurgery;  Laterality: N/A;  ? APPENDECTOMY    ? CHOLECYSTECTOMY    ? EXPLORATORY LAPAROTOMY  1988  ? removed appendix  ? GALLBLADDER SURGERY  1993  ? NOVASURE ABLATION    ? THORACOTOMY/LOBECTOMY Left 01/20/2020  ? Procedure:  THORACOTOMY/LOBECTOMY;  Surgeon: Nestor Lewandowsky, MD;  Location: [facility] ORS;  Service: Thoracic;  Laterality: Left;  ? ? ?SOCIAL HISTORY: ?Social History  ? ?Socioeconomic History  ? Marital status: Married  ?  Spouse name: Not on file  ? Number of children: Not on file  ? Years of education: Not on file  ? Highest education level: Not on file  ?Occupational History  ? Not on file  ?Tobacco Use  ? Smoking status: Former  ?  Packs/day: 0.25  ?  Types: Cigarettes  ? Smokeless tobacco: Never  ?Vaping Use  ? Vaping Use: Never used  ?Substance and Sexual Activity  ? Alcohol use: Yes  ? Drug use: No  ? Sexual activity: Never  ?Other Topics Concern  ? Not on file  ?Social History Narrative  ? Quit smoking- Dec 08, 2019- [started in 50s]; light beer/ wine daily; insurance retd. Lives in husband; lives in [place].   ? ?Social Determinants of Health  ? ?Financial Resource Strain: Not on file  ?Food Insecurity: Not on file  ?Transportation Needs: Not on file  ?Physical Activity: Not on file  ?Stress: Not on file  ?Social Connections: Not on file  ?Intimate Partner Violence: Not on file  ? ? ?FAMILY HISTORY: ?Family History  ?Problem Relation Age of Onset  ? Breast cancer Paternal Aunt   ? Hypertension Mother   ? Hyperlipidemia Mother   ? Cirrhosis Mother   ? Heart disease Father   ? Diabetes Father   ? Stroke Father   ? ? ?ALLERGIES:  is allergic to meperidine hcl and propoxyphene hcl. ? ?MEDICATIONS:  ?Current Outpatient Medications  ?Medication Sig Dispense Refill  ? ALPRAZolam (XANAX) 0.25 MG tablet Take 1 tablet (0.25 mg total) by mouth 2 (two) times daily as needed. for anxiety (Patient taking differently: Take 0.25 mg by mouth at bedtime. for anxiety) 45 tablet 4  ? aspirin EC 81 MG tablet Take 81 mg by mouth daily.    ? atorvastatin (LIPITOR) 80 MG tablet Take 80 mg by mouth daily at 6 PM.     ? calcium carbonate (TUMS - DOSED IN MG ELEMENTAL CALCIUM) 500 MG chewable tablet Chew 1-2 tablets by mouth daily as needed for  indigestion or heartburn.    ? cetirizine (ZYRTEC) 10 MG tablet Take 10 mg by mouth daily after breakfast.     ? Cholecalciferol (VITAMIN D) 50 MCG (2000 UT) tablet Take 2,000 Units by mouth daily with lunch.     ? clobetasol ointment (TEMOVATE) 6.28 % Apply 1 application topically 2 (two) times daily as needed (psoriasis).     ? fluticasone (FLONASE) 50 MCG/ACT nasal spray Place 2 sprays into both nostrils daily as needed.     ? montelukast (SINGULAIR) 10 MG tablet Take 10 mg by mouth at bedtime.    ? Propylene Glycol (SYSTANE COMPLETE) 0.6 % SOLN Apply 1 drop to eye daily as needed. 1 gtt each eye as needed for dry eyes    ? Sodium Chloride-Sodium Bicarb (NETI POT SINUS [place] NA) Place 1 Dose into the nose daily as needed (congestion).    ? ?No current facility-administered medications for this  visit.  ? ? ?  ?. ? ?PHYSICAL EXAMINATION: ?ECOG PERFORMANCE STATUS: 0 - Asymptomatic ? ?Vitals:  ? 08/13/21 1300  ?BP: 138/83  ?Pulse: 92  ?Resp: 16  ?Temp: (!) 97.5 ?F (36.4 ?C)  ? ?There were no vitals filed for this visit. ? ? ?Physical Exam ?HENT:  ?   Head: Normocephalic and atraumatic.  ?   Mouth/Throat:  ?   Pharynx: No oropharyngeal exudate.  ?Eyes:  ?   Pupils: Pupils are equal, round, and reactive to light.  ?Cardiovascular:  ?   Rate and Rhythm: Normal rate and regular rhythm.  ?Pulmonary:  ?   Effort: No respiratory distress.  ?   Breath sounds: No wheezing.  ?Abdominal:  ?   General: Bowel sounds are normal. There is no distension.  ?   Palpations: Abdomen is soft. There is no mass.  ?   Tenderness: There is no abdominal tenderness. There is no guarding or rebound.  ?Musculoskeletal:     ?   General: No tenderness. Normal range of motion.  ?   Cervical back: Normal range of motion and neck supple.  ?Skin: ?   General: Skin is warm.  ?Neurological:  ?   Mental Status: She is alert and oriented to person, place, and time.  ?Psychiatric:     ?   Mood and Affect: Affect normal.  ? ? ? ?LABORATORY DATA:  ?I have  reviewed the data as listed ?Lab Results  ?Component Value Date  ? WBC 7.2 08/13/2021  ? HGB 14.0 08/13/2021  ? HCT 40.6 08/13/2021  ? MCV 91.9 08/13/2021  ? PLT 211 08/13/2021  ? ?Recent Labs  ?  02/11/21 ?[id number] 02/12/21

## 2021-09-08 DIAGNOSIS — H35371 Puckering of macula, right eye: Secondary | ICD-10-CM | POA: Diagnosis not present

## 2021-10-06 DIAGNOSIS — K921 Melena: Secondary | ICD-10-CM | POA: Diagnosis not present

## 2021-10-15 DIAGNOSIS — L400 Psoriasis vulgaris: Secondary | ICD-10-CM | POA: Diagnosis not present

## 2021-10-15 DIAGNOSIS — D2272 Melanocytic nevi of left lower limb, including hip: Secondary | ICD-10-CM | POA: Diagnosis not present

## 2021-10-15 DIAGNOSIS — D2262 Melanocytic nevi of left upper limb, including shoulder: Secondary | ICD-10-CM | POA: Diagnosis not present

## 2021-10-15 DIAGNOSIS — D485 Neoplasm of uncertain behavior of skin: Secondary | ICD-10-CM | POA: Diagnosis not present

## 2021-10-15 DIAGNOSIS — D2261 Melanocytic nevi of right upper limb, including shoulder: Secondary | ICD-10-CM | POA: Diagnosis not present

## 2021-10-15 DIAGNOSIS — L820 Inflamed seborrheic keratosis: Secondary | ICD-10-CM | POA: Diagnosis not present

## 2021-10-15 DIAGNOSIS — L298 Other pruritus: Secondary | ICD-10-CM | POA: Diagnosis not present

## 2021-10-15 DIAGNOSIS — L538 Other specified erythematous conditions: Secondary | ICD-10-CM | POA: Diagnosis not present

## 2021-10-28 DIAGNOSIS — K5900 Constipation, unspecified: Secondary | ICD-10-CM | POA: Diagnosis not present

## 2021-10-28 DIAGNOSIS — C3412 Malignant neoplasm of upper lobe, left bronchus or lung: Secondary | ICD-10-CM | POA: Diagnosis not present

## 2021-10-28 DIAGNOSIS — K625 Hemorrhage of anus and rectum: Secondary | ICD-10-CM | POA: Diagnosis not present

## 2021-12-01 DIAGNOSIS — J029 Acute pharyngitis, unspecified: Secondary | ICD-10-CM | POA: Diagnosis not present

## 2022-02-04 ENCOUNTER — Ambulatory Visit
Admission: RE | Admit: 2022-02-04 | Discharge: 2022-02-04 | Disposition: A | Discharge status: Home / Self Care | Payer: 59 | Source: Ambulatory Visit | Attending: Internal Medicine | Admitting: Internal Medicine

## 2022-02-04 DIAGNOSIS — C3412 Malignant neoplasm of upper lobe, left bronchus or lung: Secondary | ICD-10-CM | POA: Insufficient documentation

## 2022-02-04 DIAGNOSIS — I700 Atherosclerosis of aorta: Secondary | ICD-10-CM | POA: Diagnosis not present

## 2022-02-04 DIAGNOSIS — C3490 Malignant neoplasm of unspecified part of unspecified bronchus or lung: Secondary | ICD-10-CM | POA: Diagnosis not present

## 2022-02-04 LAB — POCT I-STAT CREATININE: Creatinine, Ser: 0.5 mg/dL (ref 0.44–1.00)

## 2022-02-04 MED ORDER — IOHEXOL 300 MG/ML  SOLN
75.0000 mL | Freq: Once | INTRAMUSCULAR | Status: AC | PRN
  Administered 2022-02-04: 100 mL via INTRAVENOUS

## 2022-02-07 DIAGNOSIS — R69 Illness, unspecified: Secondary | ICD-10-CM | POA: Diagnosis not present

## 2022-02-07 DIAGNOSIS — E7800 Pure hypercholesterolemia, unspecified: Secondary | ICD-10-CM | POA: Diagnosis not present

## 2022-02-11 ENCOUNTER — Inpatient Hospital Stay: Payer: 59 | Attending: Internal Medicine

## 2022-02-11 ENCOUNTER — Inpatient Hospital Stay (HOSPITAL_BASED_OUTPATIENT_CLINIC_OR_DEPARTMENT_OTHER): Payer: 59 | Admitting: Internal Medicine

## 2022-02-11 ENCOUNTER — Encounter: Payer: Self-pay | Admitting: Internal Medicine

## 2022-02-11 DIAGNOSIS — J439 Emphysema, unspecified: Secondary | ICD-10-CM | POA: Diagnosis not present

## 2022-02-11 DIAGNOSIS — C3412 Malignant neoplasm of upper lobe, left bronchus or lung: Secondary | ICD-10-CM | POA: Insufficient documentation

## 2022-02-11 DIAGNOSIS — Z79899 Other long term (current) drug therapy: Secondary | ICD-10-CM | POA: Insufficient documentation

## 2022-02-11 DIAGNOSIS — I2510 Atherosclerotic heart disease of native coronary artery without angina pectoris: Secondary | ICD-10-CM | POA: Insufficient documentation

## 2022-02-11 LAB — CBC WITH DIFFERENTIAL/PLATELET
Abs Immature Granulocytes: 0.02 10*3/uL (ref 0.00–0.07)
Basophils Absolute: 0 10*3/uL (ref 0.0–0.1)
Basophils Relative: 1 %
Eosinophils Absolute: 0.2 10*3/uL (ref 0.0–0.5)
Eosinophils Relative: 3 %
HCT: 39.6 % (ref 36.0–46.0)
Hemoglobin: 13.8 g/dL (ref 12.0–15.0)
Immature Granulocytes: 0 %
Lymphocytes Relative: 25 %
Lymphs Abs: 1.6 10*3/uL (ref 0.7–4.0)
MCH: 31.7 pg (ref 26.0–34.0)
MCHC: 34.8 g/dL (ref 30.0–36.0)
MCV: 90.8 fL (ref 80.0–100.0)
Monocytes Absolute: 0.6 10*3/uL (ref 0.1–1.0)
Monocytes Relative: 9 %
Neutro Abs: 3.9 10*3/uL (ref 1.7–7.7)
Neutrophils Relative %: 62 %
Platelets: 187 10*3/uL (ref 150–400)
RBC: 4.36 MIL/uL (ref 3.87–5.11)
RDW: 11.5 % (ref 11.5–15.5)
WBC: 6.3 10*3/uL (ref 4.0–10.5)
nRBC: 0 % (ref 0.0–0.2)

## 2022-02-11 LAB — COMPREHENSIVE METABOLIC PANEL
ALT: 40 U/L (ref 0–44)
AST: 36 U/L (ref 15–41)
Albumin: 4.5 g/dL (ref 3.5–5.0)
Alkaline Phosphatase: 78 U/L (ref 38–126)
Anion gap: 8 (ref 5–15)
BUN: 13 mg/dL (ref 8–23)
CO2: 29 mmol/L (ref 22–32)
Calcium: 9.2 mg/dL (ref 8.9–10.3)
Chloride: 102 mmol/L (ref 98–111)
Creatinine, Ser: 0.58 mg/dL (ref 0.44–1.00)
GFR, Estimated: >60 mL/min (ref >60)
Glucose, Bld: 119 mg/dL — ABNORMAL HIGH (ref 70–99)
Potassium: 4.1 mmol/L (ref 3.5–5.1)
Sodium: 139 mmol/L (ref 135–145)
Total Bilirubin: 0.8 mg/dL (ref 0.3–1.2)
Total Protein: 7.5 g/dL (ref 6.5–8.1)

## 2022-02-11 NOTE — [place] Notes (Signed)
[place] NOTE  Patient Care Team: Kirk Ruths, MD as PCP - General (Internal Medicine) Telford Nab, RN as Oncology Nurse Navigator Cammie Sickle, MD as Consulting Physician (Internal Medicine)  CHIEF COMPLAINTS/PURPOSE OF CONSULTATION: lung cancer  Oncology History Overview Note   #SEP 2021- Left upper lobe nodule- 1.3 x 1.8 x 2.1 cm-spiculated abutting major fissure-highly concerning for primary lung cancer; 7 mm triangular subpleural nodule-question benign; SEP 27th 2021- s/p LOBECTOMY- pT1cN0- STAGE I- adenocarcinoma- Surveillaince  #Smoking-10 years [quit August 2021]; neck surgery [November 2020 Dr. 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  Visceral Pleura Invasion: Not identified  Lymphovascular Invasion: Not identified  Direct Invasion of Adjacent Structures: No adjacent structures present  Margins: All margins uninvolved by tumor  Margins examined: Bronchial, vascular, and parenchymal  Regional Lymph Nodes: Number of Lymph Nodes Involved: 0  Number of Lymph Nodes Examined: 5   Treatment Effect: No known presurgical therapy  Pathologic Stage Classification (pTNM, AJCC 8th Edition): pT1c pN0   # SURVIVORSHIP:   # GENETICS:   DIAGNOSIS: Lung cancer  STAGE:  I       ;  GOALS: cure  CURRENT/MOST RECENT THERAPY : surveillaince    Primary cancer of left upper lobe of lung ([place])  02/10/2020 Initial Diagnosis   Primary cancer of left upper lobe of lung ([place])      HISTORY OF PRESENTING ILLNESS: Ambulating independently. Alone.   Tabitha Knight 66 y.o.  female stage I lung cancer left upper lobe s/p surgery is here to review the results of her surveillance  CT  imaging.  Patient denies new problems/concerns today. Denies any pain.  Any shortness of breath or cough.  Still playing golf.   Nontender.  No masses felt.  No nausea no vomiting.  No headaches.   Review of Systems  Constitutional:  Negative for chills, diaphoresis, fever, malaise/fatigue and weight loss.  HENT:  Negative for nosebleeds and sore throat.   Eyes:  Negative for double vision.  Respiratory:  Negative for cough, hemoptysis, sputum production, shortness of breath and wheezing.   Cardiovascular:  Negative for chest pain, palpitations, orthopnea and leg swelling.  Gastrointestinal:  Negative for abdominal pain, blood in stool, constipation, diarrhea, heartburn, melena, nausea and vomiting.  Genitourinary:  Negative for dysuria, frequency and urgency.  Musculoskeletal:  Negative for back pain and joint pain.  Skin: Negative.  Negative for itching and rash.  Neurological:  Negative for dizziness, tingling, focal weakness, weakness and headaches.  Endo/Heme/Allergies:  Does not bruise/bleed easily.  Psychiatric/Behavioral:  Negative for depression. The patient is not nervous/anxious and does not have insomnia.      MEDICAL HISTORY:  Past Medical History:  Diagnosis Date   Anxiety    Arthritis    History of hiatal hernia    HLD (hyperlipidemia)    Osteoporosis    Psoriasis    Sinus congestion     SURGICAL HISTORY: Past Surgical History:  Procedure Laterality Date   ANTERIOR CERVICAL DECOMP/DISCECTOMY FUSION N/A 03/13/2019   Procedure: ANTERIOR CERVICAL DECOMPRESSION/DISCECTOMY FUSION 2 LEVELS C5-7;  Surgeon: Meade Maw, MD;  Location:  [facility] ORS;  Service: Neurosurgery;  Laterality: N/A;   APPENDECTOMY     CHOLECYSTECTOMY     EXPLORATORY LAPAROTOMY  1988   removed appendix   GALLBLADDER SURGERY  1993   NOVASURE ABLATION     THORACOTOMY/LOBECTOMY Left 01/20/2020   Procedure: THORACOTOMY/LOBECTOMY;  Surgeon: Nestor Lewandowsky, MD;  Location: [facility] ORS;  Service:  Thoracic;  Laterality: Left;    SOCIAL HISTORY: Social History   Socioeconomic History   Marital status: Married    Spouse name: Not on file   Number of children: Not on file   Years of education: Not on file   Highest education level: Not on file  Occupational History   Not on file  Tobacco Use   Smoking status: Former    Packs/day: 0.25    Types: Cigarettes   Smokeless tobacco: Never  Vaping Use   Vaping Use: Never used  Substance and Sexual Activity   Alcohol use: Yes   Drug use: No   Sexual activity: Never  Other Topics Concern   Not on file  Social History Narrative   Quit smoking- Dec 08, 2019- [started in 50s]; light beer/ wine daily; insurance retd. Lives in husband; lives in [place].    Social Determinants of Health   Financial Resource Strain: Not on file  Food Insecurity: Not on file  Transportation Needs: Not on file  Physical Activity: Not on file  Stress: Not on file  Social Connections: Not on file  Intimate Partner Violence: Not on file    FAMILY HISTORY: Family History  Problem Relation Age of Onset   Breast cancer Paternal Aunt    Hypertension Mother    Hyperlipidemia Mother    Cirrhosis Mother    Heart disease Father    Diabetes Father    Stroke Father     ALLERGIES:  is allergic to meperidine hcl and propoxyphene hcl.  MEDICATIONS:  Current Outpatient Medications  Medication Sig Dispense Refill   ALPRAZolam (XANAX) 0.25 MG tablet Take 1 tablet (0.25 mg total) by mouth 2 (two) times daily as needed. for anxiety (Patient taking differently: Take 0.25 mg by mouth at bedtime. for anxiety) 45 tablet 4   aspirin EC 81 MG tablet Take 81 mg by mouth daily.     atorvastatin (LIPITOR) 80 MG tablet Take 80 mg by mouth daily at 6 PM.      calcium carbonate (TUMS - DOSED IN MG ELEMENTAL CALCIUM) 500 MG chewable tablet Chew 1-2 tablets by mouth daily as needed for indigestion or heartburn.     cetirizine (ZYRTEC) 10 MG tablet Take 10 mg by mouth  daily after breakfast.      Cholecalciferol (VITAMIN D) 50 MCG (2000 UT) tablet Take 2,000 Units by mouth daily with lunch.      clobetasol ointment (TEMOVATE) 5.42 % Apply 1 application topically 2 (two) times daily as needed (psoriasis).      fluticasone (FLONASE) 50 MCG/ACT nasal spray Place 2 sprays into both nostrils daily as needed.      montelukast (SINGULAIR) 10 MG tablet Take 10 mg by mouth at bedtime.     Propylene Glycol (SYSTANE COMPLETE) 0.6 % SOLN Apply 1 drop to eye daily as needed. 1 gtt each eye as needed for dry eyes     Sodium Chloride-Sodium Bicarb (NETI POT SINUS [place] NA) Place 1 Dose into the nose daily as needed (congestion).     No current facility-administered medications for this visit.      Marland Kitchen  PHYSICAL EXAMINATION:  ECOG PERFORMANCE STATUS: 0 - Asymptomatic  Vitals:   02/11/22 1358  BP: (!) 141/73  Pulse: 80  Resp: 20  Temp: (!) 96.5 F (35.8 C)  SpO2: 100%   Filed Weights   02/11/22 1358  Weight: 142 lb 3.2 oz (64.5 kg)     Physical Exam HENT:     Head: Normocephalic and atraumatic.     Mouth/Throat:     Pharynx: No oropharyngeal exudate.  Eyes:     Pupils: Pupils are equal, round, and reactive to light.  Cardiovascular:     Rate and Rhythm: Normal rate and regular rhythm.  Pulmonary:     Effort: No respiratory distress.     Breath sounds: No wheezing.  Abdominal:     General: Bowel sounds are normal. There is no distension.     Palpations: Abdomen is soft. There is no mass.     Tenderness: There is no abdominal tenderness. There is no guarding or rebound.  Musculoskeletal:        General: No tenderness. Normal range of motion.     Cervical back: Normal range of motion and neck supple.  Skin:    General: Skin is warm.  Neurological:     Mental Status: She is alert and oriented to person, place, and time.  Psychiatric:        Mood and Affect: Affect normal.      LABORATORY DATA:  I have reviewed the data as listed Lab Results   Component Value Date   WBC 6.3 02/11/2022   HGB 13.8 02/11/2022   HCT 39.6 02/11/2022   MCV 90.8 02/11/2022   PLT 187 02/11/2022   Recent Labs    02/12/21 1258 08/13/21 1302 02/04/22 1215 02/11/22 1324  NA 138 136  --  139  K 4.3 4.0  --  4.1  CL 101 102  --  102  CO2 30 29  --  29  GLUCOSE 133* 121*  --  119*  BUN 10 11  --  13  CREATININE 0.60 0.60 0.50 0.58  CALCIUM 9.1 8.7*  --  9.2  GFRNONAA >60 >60  --  >60  PROT 7.3 7.2  --  7.5  ALBUMIN 4.4 4.5  --  4.5  AST 31 34  --  36  ALT 29 33  --  40  ALKPHOS 80 84  --  78  BILITOT 0.7 0.6  --  0.8    RADIOGRAPHIC STUDIES: I have personally reviewed the radiological images as listed and agreed with the findings in the report. CT CHEST W CONTRAST  Result Date: 02/07/2022 CLINICAL DATA:  Non-small cell lung cancer restaging, status post left upper lobectomy, former smoker * Tracking Code: BO * EXAM: CT CHEST WITH CONTRAST TECHNIQUE: Multidetector CT imaging of the chest was performed during intravenous contrast administration. RADIATION DOSE REDUCTION: This exam was performed according to the departmental dose-optimization program which includes automated exposure control, adjustment of the mA and/or kV according to patient size and/or use of iterative reconstruction technique. CONTRAST:  153mL OMNIPAQUE IOHEXOL 300 MG/ML  SOLN COMPARISON:  08/06/2021 FINDINGS: Cardiovascular: Scattered aortic atherosclerosis. Normal heart size. Left coronary artery calcifications. No pericardial effusion. Mediastinum/Nodes: No enlarged mediastinal, hilar, or axillary lymph nodes. Small hiatal hernia. Thyroid gland, trachea, and esophagus demonstrate no significant findings. Lungs/Pleura: Status post left upper lobectomy. Mild, diffuse bilateral bronchial wall thickening. No pleural effusion or pneumothorax. Upper Abdomen: No acute abnormality.  Status post hysterectomy. Musculoskeletal: No chest wall abnormality. No acute osseous  findings.  IMPRESSION: 1. Status post left upper lobectomy. No evidence of recurrent or metastatic disease in the chest. 2. Mild, diffuse bilateral bronchial wall thickening, consistent with nonspecific infectious or inflammatory bronchitis. 3. Coronary artery disease. Aortic Atherosclerosis (ICD10-I70.0). Electronically Signed   By: Delanna Ahmadi M.D.   On: 02/07/2022 07:41     ASSESSMENT & PLAN:   Primary cancer of left upper lobe of lung ([place]) #Left upper lobe lung cancer-adenocarcinoma; stage I.  [sep 6294]; no adjuvant therapy. OCT 2023- Status post left upper lobectomy. No evidence of recurrent or metastatic disease in the chest.  Continue surveillance.  #Incidental findings on Imaging CT OTC 2023, 2023: Mild emphysema; coronary arteriosclerosis/atherosclerosis I reviewed/discussed/counseled the patient.  Recommend follow-up/ with PCP. Awaiting to discuss with PCP.   # DISPOSITION: # Follow up in 6 months- MD;labs- cbc/cmp non-contrast CT chest prior-Dr.B  # I reviewed the blood work- with the patient in detail; also reviewed the imaging independently [as summarized above]; and with the patient in detail.   Cc; dr.Anderson.     All questions were answered. The patient knows to call the clinic with any problems, questions or concerns.    Cammie Sickle, MD 02/11/2022 2:45 PM

## 2022-02-11 NOTE — Assessment & Plan Note (Addendum)
#  Left upper lobe lung cancer-adenocarcinoma; stage I.  [sep 6659]; no adjuvant therapy. OCT 2023- Status post left upper lobectomy. No evidence of recurrent or metastatic disease in the chest.  Continue surveillance.  #Incidental findings on Imaging CT OTC 2023, 2023: Mild emphysema; coronary arteriosclerosis/atherosclerosis I reviewed/discussed/counseled the patient.  Recommend follow-up/ with PCP. Awaiting to discuss with PCP.   # DISPOSITION: # Follow up in 6 months- MD;labs- cbc/cmp non-contrast CT chest prior-Dr.B  # I reviewed the blood work- with the patient in detail; also reviewed the imaging independently [as summarized above]; and with the patient in detail.   Cc; dr.Anderson.

## 2022-02-14 DIAGNOSIS — I700 Atherosclerosis of aorta: Secondary | ICD-10-CM | POA: Diagnosis not present

## 2022-02-14 DIAGNOSIS — Z23 Encounter for immunization: Secondary | ICD-10-CM | POA: Diagnosis not present

## 2022-02-14 DIAGNOSIS — R030 Elevated blood-pressure reading, without diagnosis of hypertension: Secondary | ICD-10-CM | POA: Diagnosis not present

## 2022-02-14 DIAGNOSIS — F419 Anxiety disorder, unspecified: Secondary | ICD-10-CM | POA: Diagnosis not present

## 2022-02-14 DIAGNOSIS — I2510 Atherosclerotic heart disease of native coronary artery without angina pectoris: Secondary | ICD-10-CM | POA: Diagnosis not present

## 2022-02-14 DIAGNOSIS — C3411 Malignant neoplasm of upper lobe, right bronchus or lung: Secondary | ICD-10-CM | POA: Diagnosis not present

## 2022-02-14 DIAGNOSIS — Z0000 Encounter for general adult medical examination without abnormal findings: Secondary | ICD-10-CM | POA: Diagnosis not present

## 2022-02-14 DIAGNOSIS — R69 Illness, unspecified: Secondary | ICD-10-CM | POA: Diagnosis not present

## 2022-04-12 ENCOUNTER — Encounter
Admission: RE | Disposition: A | Discharge status: Home / Self Care | Payer: Self-pay | Source: Home / Self Care | Attending: Gastroenterology

## 2022-04-12 ENCOUNTER — Encounter: Payer: Self-pay | Admitting: *Deleted

## 2022-04-12 ENCOUNTER — Ambulatory Visit: Payer: Medicare Other | Admitting: Anesthesiology

## 2022-04-12 ENCOUNTER — Ambulatory Visit
Admission: RE | Admit: 2022-04-12 | Discharge: 2022-04-12 | Disposition: A | Discharge status: Home / Self Care | Payer: Medicare Other | Attending: Gastroenterology | Admitting: Gastroenterology

## 2022-04-12 DIAGNOSIS — D124 Benign neoplasm of descending colon: Secondary | ICD-10-CM | POA: Insufficient documentation

## 2022-04-12 DIAGNOSIS — I10 Essential (primary) hypertension: Secondary | ICD-10-CM | POA: Insufficient documentation

## 2022-04-12 DIAGNOSIS — K5730 Diverticulosis of large intestine without perforation or abscess without bleeding: Secondary | ICD-10-CM | POA: Diagnosis not present

## 2022-04-12 DIAGNOSIS — K625 Hemorrhage of anus and rectum: Secondary | ICD-10-CM | POA: Insufficient documentation

## 2022-04-12 DIAGNOSIS — Z85118 Personal history of other malignant neoplasm of bronchus and lung: Secondary | ICD-10-CM | POA: Insufficient documentation

## 2022-04-12 DIAGNOSIS — K640 First degree hemorrhoids: Secondary | ICD-10-CM | POA: Insufficient documentation

## 2022-04-12 DIAGNOSIS — Z9049 Acquired absence of other specified parts of digestive tract: Secondary | ICD-10-CM | POA: Diagnosis not present

## 2022-04-12 DIAGNOSIS — K5900 Constipation, unspecified: Secondary | ICD-10-CM | POA: Insufficient documentation

## 2022-04-12 DIAGNOSIS — E785 Hyperlipidemia, unspecified: Secondary | ICD-10-CM | POA: Diagnosis not present

## 2022-04-12 DIAGNOSIS — F419 Anxiety disorder, unspecified: Secondary | ICD-10-CM | POA: Diagnosis not present

## 2022-04-12 HISTORY — PX: COLONOSCOPY WITH PROPOFOL: SHX5780

## 2022-04-12 SURGERY — COLONOSCOPY WITH PROPOFOL
Anesthesia: General

## 2022-04-12 MED ORDER — SODIUM CHLORIDE 0.9 % IV SOLN: INTRAVENOUS | Status: DC

## 2022-04-12 MED ORDER — PROPOFOL 10 MG/ML IV BOLUS
INTRAVENOUS | Status: DC | PRN
  Administered 2022-04-12: 30 mg via INTRAVENOUS
  Administered 2022-04-12: 100 mg via INTRAVENOUS

## 2022-04-12 MED ORDER — PROPOFOL 500 MG/50ML IV EMUL
INTRAVENOUS | Status: DC | PRN
  Administered 2022-04-12: 125 ug/kg/min via INTRAVENOUS

## 2022-04-12 MED ORDER — PROPOFOL 1000 MG/100ML IV EMUL
INTRAVENOUS | Status: AC
  Filled 2022-04-12: qty 100

## 2022-04-12 NOTE — Transfer of Care (Signed)
Immediate Anesthesia Transfer of Care Note  Patient: Tabitha Knight  Procedure(s) Performed: COLONOSCOPY WITH PROPOFOL  Patient Location: Endoscopy Unit  Anesthesia Type:General  Level of Consciousness: awake, alert , and oriented  Airway & Oxygen Therapy: Patient Spontanous Breathing  Post-op Assessment: Report given to RN and Post -op Vital signs reviewed and stable  Post vital signs: Reviewed and stable  Last Vitals:  Vitals Value Taken Time  BP 121/75 04/12/22 0902  Temp    Pulse 78 04/12/22 0902  Resp 16 04/12/22 0902  SpO2 98 % 04/12/22 0902    Last Pain:  Vitals:   04/12/22 0800  TempSrc: Temporal  PainSc: 0-No pain         Complications: No notable events documented.

## 2022-04-12 NOTE — H&P (Signed)
Outpatient short stay form Pre-procedure 04/12/2022  Lesly Rubenstein, MD  Primary Physician: Kirk Ruths, MD  Reason for visit:  Rectal bleeding  History of present illness:    66 y/o lady with history of lung cancer, anxiety, and HLD here for colonoscopy due to intermittent small volume hematochezia. Last colonoscopy in 2010 was normal. No blood thinners. No family history of GI malignancies. History of appendectomy and cholecystectomy.    Current Facility-Administered Medications:    0.9 %  sodium chloride infusion, , Intravenous, Continuous, Markeeta Scalf, Hilton Cork, MD, Last Rate: 20 mL/hr at 04/12/22 0819, New Bag at 04/12/22 0819  Medications Prior to Admission  Medication Sig Dispense Refill Last Dose   aspirin EC 81 MG tablet Take 81 mg by mouth daily.   04/11/2022   atorvastatin (LIPITOR) 80 MG tablet Take 80 mg by mouth daily at 6 PM.    04/11/2022   calcium carbonate (TUMS - DOSED IN MG ELEMENTAL CALCIUM) 500 MG chewable tablet Chew 1-2 tablets by mouth daily as needed for indigestion or heartburn.   04/11/2022   cetirizine (ZYRTEC) 10 MG tablet Take 10 mg by mouth daily after breakfast.    04/11/2022   Cholecalciferol (VITAMIN D) 50 MCG (2000 UT) tablet Take 2,000 Units by mouth daily with lunch.    04/11/2022   fluticasone (FLONASE) 50 MCG/ACT nasal spray Place 2 sprays into both nostrils daily as needed.    04/11/2022   montelukast (SINGULAIR) 10 MG tablet Take 10 mg by mouth at bedtime.   04/11/2022   ALPRAZolam (XANAX) 0.25 MG tablet Take 1 tablet (0.25 mg total) by mouth 2 (two) times daily as needed. for anxiety (Patient taking differently: Take 0.25 mg by mouth at bedtime. for anxiety) 45 tablet 4    clobetasol ointment (TEMOVATE) 7.37 % Apply 1 application topically 2 (two) times daily as needed (psoriasis).       Propylene Glycol (SYSTANE COMPLETE) 0.6 % SOLN Apply 1 drop to eye daily as needed. 1 gtt each eye as needed for dry eyes      Sodium Chloride-Sodium  Bicarb (NETI POT SINUS [place] NA) Place 1 Dose into the nose daily as needed (congestion).        Allergies  Allergen Reactions   Meperidine Hcl     Unknown reaction   Propoxyphene Hcl     Unknown reaction     Past Medical History:  Diagnosis Date   Anxiety    Arthritis    History of hiatal hernia    HLD (hyperlipidemia)    Osteoporosis    Psoriasis    Sinus congestion     Review of systems:  Otherwise negative.    Physical Exam  Gen: Alert, oriented. Appears stated age.  HEENT: PERRLA. Lungs: No respiratory distress CV: RRR Abd: soft, benign, no masses Ext: No edema    Planned procedures: Proceed with colonoscopy. The patient understands the nature of the planned procedure, indications, risks, alternatives and potential complications including but not limited to bleeding, infection, perforation, damage to internal organs and possible oversedation/side effects from anesthesia. The patient agrees and gives consent to proceed.  Please refer to procedure notes for findings, recommendations and patient disposition/instructions.     Lesly Rubenstein, MD [facility]

## 2022-04-12 NOTE — Interval H&P Note (Signed)
History and Physical Interval Note:  04/12/2022 8:24 AM  Tabitha Knight  has presented today for surgery, with the diagnosis of Rectal Bleeding.  The various methods of treatment have been discussed with the patient and family. After consideration of risks, benefits and other options for treatment, the patient has consented to  Procedure(s): COLONOSCOPY WITH PROPOFOL (N/A) as a surgical intervention.  The patient's history has been reviewed, patient examined, no change in status, stable for surgery.  I have reviewed the patient's chart and labs.  Questions were answered to the patient's satisfaction.     Lesly Rubenstein  Ok to proceed with colonoscopy

## 2022-04-12 NOTE — Anesthesia Postprocedure Evaluation (Signed)
Anesthesia Post Note  Patient: Tabitha Knight  Procedure(s) Performed: COLONOSCOPY WITH PROPOFOL  Patient location during evaluation: Endoscopy Anesthesia Type: General Level of consciousness: awake and alert Pain management: pain level controlled Vital Signs Assessment: post-procedure vital signs reviewed and stable Respiratory status: spontaneous breathing, nonlabored ventilation and respiratory function stable Cardiovascular status: blood pressure returned to baseline and stable Postop Assessment: no apparent nausea or vomiting Anesthetic complications: no   No notable events documented.   Last Vitals:  Vitals:   04/12/22 0912 04/12/22 0922  BP: 133/81 139/78  Pulse: 73 73  Resp: 20 12  Temp:    SpO2: 98% 100%    Last Pain:  Vitals:   04/12/22 0800  TempSrc: Temporal  PainSc: 0-No pain                 Alphonsus Sias

## 2022-04-12 NOTE — Anesthesia Preprocedure Evaluation (Signed)
Anesthesia Evaluation  Patient identified by MRN, date of birth, ID band Patient awake    Reviewed: Allergy & Precautions, NPO status , Patient's Chart, lab work & pertinent test results  Airway Mallampati: III  TM Distance: >3 FB Neck ROM: full    Dental  (+) Chipped   Pulmonary neg pulmonary ROS, former smoker   Pulmonary exam normal        Cardiovascular hypertension, Normal cardiovascular exam     Neuro/Psych  PSYCHIATRIC DISORDERS Anxiety      Neuromuscular disease    GI/Hepatic negative GI ROS, Neg liver ROS, hiatal hernia,,,  Endo/Other  negative endocrine ROS    Renal/GU negative Renal ROS  negative genitourinary   Musculoskeletal  (+) Arthritis ,    Abdominal   Peds  Hematology negative hematology ROS (+)   Anesthesia Other Findings Past Medical History: No date: Anxiety No date: Arthritis No date: History of hiatal hernia No date: HLD (hyperlipidemia) No date: Osteoporosis No date: Psoriasis No date: Sinus congestion  Past Surgical History: 03/13/2019: ANTERIOR CERVICAL DECOMP/DISCECTOMY FUSION; N/A     Comment:  Procedure: ANTERIOR CERVICAL DECOMPRESSION/DISCECTOMY               FUSION 2 LEVELS C5-7;  Surgeon: Meade Maw, MD;                Location: [facility] ORS;  Service: Neurosurgery;  Laterality:               N/A; No date: APPENDECTOMY No date: CHOLECYSTECTOMY 1988: EXPLORATORY LAPAROTOMY     Comment:  removed appendix 1993: GALLBLADDER SURGERY No date: NOVASURE ABLATION 01/20/2020: THORACOTOMY/LOBECTOMY; Left     Comment:  Procedure: THORACOTOMY/LOBECTOMY;  Surgeon: Nestor Lewandowsky, MD;  Location: [facility] ORS;  Service: Thoracic;                Laterality: Left;  BMI    Body Mass Index: 26.07 kg/m      Reproductive/Obstetrics negative OB ROS                             Anesthesia Physical Anesthesia Plan  ASA: 2  Anesthesia Plan: General    Post-op Pain Management:    Induction: Intravenous  PONV Risk Score and Plan: Propofol infusion and TIVA  Airway Management Planned: Natural Airway and Nasal Cannula  Additional Equipment:   Intra-op Plan:   Post-operative Plan:   Informed Consent: I have reviewed the patients History and Physical, chart, labs and discussed the procedure including the risks, benefits and alternatives for the proposed anesthesia with the patient or authorized representative who has indicated his/her understanding and acceptance.     Dental Advisory Given  Plan Discussed with: Anesthesiologist, CRNA and Surgeon  Anesthesia Plan Comments: (Patient consented for risks of anesthesia including but not limited to:  - adverse reactions to medications - risk of airway placement if required - damage to eyes, teeth, lips or other oral mucosa - nerve damage due to positioning  - sore throat or hoarseness - Damage to heart, brain, nerves, lungs, other parts of body or loss of life  Patient voiced understanding.)       Anesthesia Quick Evaluation

## 2022-04-12 NOTE — Op Note (Signed)
[facility] Patient Name: Tabitha Knight Procedure Date: 04/12/2022 8:33 AM MRN: [id number] Account #: [id number] Date of Birth: 08/30/1955 Admit Type: Outpatient Age: 66 Room: [facility] ENDO ROOM 1 Gender: Female Note Status: Supervisor Override Instrument Name: Jasper Riling [id number] Procedure:             Colonoscopy Indications:           Rectal bleeding, Constipation Providers:             Andrey Farmer MD, MD Referring MD:          Andrey Farmer MD, MD (Referring MD), Ocie Cornfield.                         Ouida Sills MD, MD (Referring MD) Medicines:             Monitored Anesthesia Care Complications:         No immediate complications. Estimated blood loss:                         Minimal. Procedure:             Pre-Anesthesia Assessment:                        - Prior to the procedure, a History and Physical was                         performed, and patient medications and allergies were                         reviewed. The patient is competent. The risks and                         benefits of the procedure and the sedation options and                         risks were discussed with the patient. All questions                         were answered and informed consent was obtained.                         Patient identification and proposed procedure were                         verified by the physician, the nurse, the                         anesthesiologist, the anesthetist and the technician                         in the endoscopy suite. Mental Status Examination:                         alert and oriented. Airway Examination: normal                         oropharyngeal airway and neck mobility. Respiratory  Examination: clear to auscultation. CV Examination:                         normal. Prophylactic Antibiotics: The patient does not                         require prophylactic antibiotics. Prior                          Anticoagulants: The patient has taken no anticoagulant                         or antiplatelet agents. ASA Grade Assessment: II - A                         patient with mild systemic disease. After reviewing                         the risks and benefits, the patient was deemed in                         satisfactory condition to undergo the procedure. The                         anesthesia plan was to use monitored anesthesia care                         (MAC). Immediately prior to administration of                         medications, the patient was re-assessed for adequacy                         to receive sedatives. The heart rate, respiratory                         rate, oxygen saturations, blood pressure, adequacy of                         pulmonary ventilation, and response to care were                         monitored throughout the procedure. The physical                         status of the patient was re-assessed after the                         procedure.                        After obtaining informed consent, the colonoscope was                         passed under direct vision. Throughout the procedure,                         the patient's blood pressure, pulse, and oxygen  saturations were monitored continuously. The                         Colonoscope was introduced through the anus and                         advanced to the the cecum, identified by appendiceal                         orifice and ileocecal valve. The colonoscopy was                         performed without difficulty. The patient tolerated                         the procedure well. The quality of the bowel                         preparation was good. The ileocecal valve, appendiceal                         orifice, and rectum were photographed. Findings:      The perianal and digital rectal examinations were normal.      Multiple small-mouthed diverticula were  found in the hepatic flexure.      A 3 mm polyp was found in the descending colon. The polyp was sessile.       The polyp was removed with a cold snare. Resection and retrieval were       complete. Estimated blood loss was minimal.      Internal hemorrhoids were found during retroflexion. The hemorrhoids       were Grade I (internal hemorrhoids that do not prolapse).      The exam was otherwise without abnormality on direct and retroflexion       views. Impression:            - Diverticulosis at the hepatic flexure.                        - One 3 mm polyp in the descending colon, removed with                         a cold snare. Resected and retrieved.                        - Internal hemorrhoids.                        - The examination was otherwise normal on direct and                         retroflexion views. Recommendation:        - Discharge patient to home.                        - Resume previous diet.                        - Continue present medications.                        -  Await pathology results.                        - Repeat colonoscopy in 7 years for surveillance.                        - Return to referring physician as previously                         scheduled. Procedure Code(s):     --- Professional ---                        [phone], Colonoscopy, flexible; with removal of                         tumor(s), polyp(s), or other lesion(s) by snare                         technique Diagnosis Code(s):     --- Professional ---                        Z12.11, Encounter for screening for malignant neoplasm                         of colon                        K64.0, First degree hemorrhoids                        D12.4, Benign neoplasm of descending colon                        K57.30, Diverticulosis of large intestine without                         perforation or abscess without bleeding CPT copyright 2022 American Medical Association. All rights reserved. The  codes documented in this report are preliminary and upon coder review may  be revised to meet current compliance requirements. Andrey Farmer MD, MD 04/12/2022 9:03:02 AM Number of Addenda: 0 Note Initiated On: 04/12/2022 8:33 AM Scope Withdrawal Time: 0 hours 9 minutes 52 seconds  Total Procedure Duration: 0 hours 14 minutes 55 seconds  Estimated Blood Loss:  Estimated blood loss was minimal.      [facility]

## 2022-04-13 ENCOUNTER — Encounter: Payer: Self-pay | Admitting: Gastroenterology

## 2022-04-13 LAB — SURGICAL PATHOLOGY

## 2022-04-20 ENCOUNTER — Other Ambulatory Visit: Payer: Self-pay | Admitting: Internal Medicine

## 2022-04-20 DIAGNOSIS — Z1231 Encounter for screening mammogram for malignant neoplasm of breast: Secondary | ICD-10-CM

## 2022-05-04 ENCOUNTER — Ambulatory Visit
Admission: RE | Admit: 2022-05-04 | Discharge: 2022-05-04 | Disposition: A | Discharge status: Home / Self Care | Payer: Medicare Other | Source: Ambulatory Visit | Attending: Internal Medicine | Admitting: Internal Medicine

## 2022-05-04 DIAGNOSIS — Z1231 Encounter for screening mammogram for malignant neoplasm of breast: Secondary | ICD-10-CM | POA: Insufficient documentation

## 2022-08-12 ENCOUNTER — Ambulatory Visit
Admission: RE | Admit: 2022-08-12 | Discharge: 2022-08-12 | Disposition: A | Discharge status: Home / Self Care | Payer: Medicare Other | Source: Ambulatory Visit | Attending: Internal Medicine | Admitting: Internal Medicine

## 2022-08-12 DIAGNOSIS — C3412 Malignant neoplasm of upper lobe, left bronchus or lung: Secondary | ICD-10-CM | POA: Diagnosis present

## 2022-08-16 ENCOUNTER — Inpatient Hospital Stay: Payer: Medicare Other | Attending: Internal Medicine

## 2022-08-16 ENCOUNTER — Encounter: Payer: Self-pay | Admitting: Internal Medicine

## 2022-08-16 ENCOUNTER — Inpatient Hospital Stay (HOSPITAL_BASED_OUTPATIENT_CLINIC_OR_DEPARTMENT_OTHER): Payer: Medicare Other | Admitting: Internal Medicine

## 2022-08-16 DIAGNOSIS — E876 Hypokalemia: Secondary | ICD-10-CM | POA: Insufficient documentation

## 2022-08-16 DIAGNOSIS — Z9049 Acquired absence of other specified parts of digestive tract: Secondary | ICD-10-CM | POA: Insufficient documentation

## 2022-08-16 DIAGNOSIS — C3412 Malignant neoplasm of upper lobe, left bronchus or lung: Secondary | ICD-10-CM

## 2022-08-16 DIAGNOSIS — Z902 Acquired absence of lung [part of]: Secondary | ICD-10-CM | POA: Diagnosis not present

## 2022-08-16 DIAGNOSIS — Z87891 Personal history of nicotine dependence: Secondary | ICD-10-CM | POA: Insufficient documentation

## 2022-08-16 DIAGNOSIS — K449 Diaphragmatic hernia without obstruction or gangrene: Secondary | ICD-10-CM | POA: Insufficient documentation

## 2022-08-16 DIAGNOSIS — Z85118 Personal history of other malignant neoplasm of bronchus and lung: Secondary | ICD-10-CM | POA: Diagnosis present

## 2022-08-16 LAB — CBC WITH DIFFERENTIAL/PLATELET
Abs Immature Granulocytes: 0.02 10*3/uL (ref 0.00–0.07)
Basophils Absolute: 0 10*3/uL (ref 0.0–0.1)
Basophils Relative: 1 %
Eosinophils Absolute: 0.1 10*3/uL (ref 0.0–0.5)
Eosinophils Relative: 2 %
HCT: 41 % (ref 36.0–46.0)
Hemoglobin: 14.7 g/dL (ref 12.0–15.0)
Immature Granulocytes: 0 %
Lymphocytes Relative: 26 %
Lymphs Abs: 1.4 10*3/uL (ref 0.7–4.0)
MCH: 31.5 pg (ref 26.0–34.0)
MCHC: 35.9 g/dL (ref 30.0–36.0)
MCV: 87.8 fL (ref 80.0–100.0)
Monocytes Absolute: 0.5 10*3/uL (ref 0.1–1.0)
Monocytes Relative: 10 %
Neutro Abs: 3.4 10*3/uL (ref 1.7–7.7)
Neutrophils Relative %: 61 %
Platelets: 245 10*3/uL (ref 150–400)
RBC: 4.67 MIL/uL (ref 3.87–5.11)
RDW: 11.2 % — ABNORMAL LOW (ref 11.5–15.5)
WBC: 5.5 10*3/uL (ref 4.0–10.5)
nRBC: 0 % (ref 0.0–0.2)

## 2022-08-16 LAB — COMPREHENSIVE METABOLIC PANEL
ALT: 34 U/L (ref 0–44)
AST: 34 U/L (ref 15–41)
Albumin: 4.6 g/dL (ref 3.5–5.0)
Alkaline Phosphatase: 97 U/L (ref 38–126)
Anion gap: 10 (ref 5–15)
BUN: 9 mg/dL (ref 8–23)
CO2: 29 mmol/L (ref 22–32)
Calcium: 9.2 mg/dL (ref 8.9–10.3)
Chloride: 93 mmol/L — ABNORMAL LOW (ref 98–111)
Creatinine, Ser: 0.52 mg/dL (ref 0.44–1.00)
GFR, Estimated: >60 mL/min (ref >60)
Glucose, Bld: 113 mg/dL — ABNORMAL HIGH (ref 70–99)
Potassium: 3.2 mmol/L — ABNORMAL LOW (ref 3.5–5.1)
Sodium: 132 mmol/L — ABNORMAL LOW (ref 135–145)
Total Bilirubin: 0.8 mg/dL (ref 0.3–1.2)
Total Protein: 7.9 g/dL (ref 6.5–8.1)

## 2022-08-16 NOTE — Progress Notes (Signed)
Playing golf. Wore a heart monitor for 1 month. Heart skips every once in awhile. Otherwise everything is okay. Here for CT results. Denies any chest pain. Has occ acid indigestion if she eats the wrong thing. Denies dyspnea.Appetite is good. Denies cough. Has seasonal allergies, sneezing.

## 2022-08-16 NOTE — Assessment & Plan Note (Addendum)
#  Left upper lobe lung cancer-adenocarcinoma; stage I.  [sep 2021]; no adjuvant therapy.  APRIL 19th, 2024- tatus post left upper lobectomy. No evidence of recurrent or metastatic disease. Will order CT scan for 6 months; and then annual x 5 years.    #Incidental findings on Imaging CT APRIL 2024-small hiatal hernia, prior cholecystectomy, vascular calcifications, and a stable 8 mm rim calcified lesion in the posterior left upper kidney I reviewed/discussed/counseled the patient.  Recommend follow-up/ with PCP.  # Hypokalemia-  [sec to Anti-HTN; Yetta Barre, [facility]]- discussed re: dietary recommendation. Awaiting evaluation with Dr.Andeson this week.   # DISPOSITION: # Follow up in 6 months- MD;labs- cbc/cmp non-contrast CT chest prior-Dr.B  # I reviewed the blood work- with the patient in detail; also reviewed the imaging independently [as summarized above]; and with the patient in detail.    Cc; dr.Anderson.

## 2022-08-16 NOTE — Progress Notes (Signed)
[facility] CONSULT NOTE  Patient Care Team: Lauro Regulus, MD as PCP - General (Internal Medicine) Glory Buff, RN as Oncology Nurse Navigator Earna Coder, MD as Consulting Physician (Internal Medicine)  CHIEF COMPLAINTS/PURPOSE OF CONSULTATION: lung cancer  Oncology History Overview Note   #SEP 2021- Left upper lobe nodule- 1.3 x 1.8 x 2.1 cm-spiculated abutting major fissure-highly concerning for primary lung cancer; 7 mm triangular subpleural nodule-question benign; SEP 27th 2021- s/p LOBECTOMY- pT1cN0- STAGE I- adenocarcinoma- Surveillaince  #Smoking-10 years [quit August 2021]; neck surgery [November 2020 Dr. 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  Visceral Pleura Invasion: Not identified  Lymphovascular Invasion: Not identified  Direct Invasion of Adjacent Structures: No adjacent structures present  Margins: All margins uninvolved by tumor  Margins examined: Bronchial, vascular, and parenchymal  Regional Lymph Nodes: Number of Lymph Nodes Involved: 0  Number of Lymph Nodes Examined: 5   Treatment Effect: No known presurgical therapy  Pathologic Stage Classification (pTNM, AJCC 8th Edition): pT1c pN0   # SURVIVORSHIP:   # GENETICS:   DIAGNOSIS: Lung cancer  STAGE:  I       ;  GOALS: cure  CURRENT/MOST RECENT THERAPY : surveillaince    Primary cancer of left upper lobe of lung  02/10/2020 Initial Diagnosis   Primary cancer of left upper lobe of lung (HCC)    HISTORY OF PRESENTING ILLNESS: Ambulating independently. Alone.   Tabitha Knight 67 y.o.  female stage I lung cancer left upper lobe s/p surgery is here to review the results of her surveillance CT  imaging.  Patient continues to be physically active. Still playing golf.  Denies any pain.  Any shortness of breath or cough.     No masses felt.  No nausea no vomiting.  No headaches.   Review of Systems  Constitutional:  Negative for chills, diaphoresis, fever, malaise/fatigue and weight loss.  HENT:  Negative for nosebleeds and sore throat.   Eyes:  Negative for double vision.  Respiratory:  Negative for cough, hemoptysis, sputum production, shortness of breath and wheezing.   Cardiovascular:  Negative for chest pain, palpitations, orthopnea and leg swelling.  Gastrointestinal:  Negative for abdominal pain, blood in stool, constipation, diarrhea, heartburn, melena, nausea and vomiting.  Genitourinary:  Negative for dysuria, frequency and urgency.  Musculoskeletal:  Negative for back pain and joint pain.  Skin: Negative.  Negative for itching and rash.  Neurological:  Negative for dizziness, tingling, focal weakness, weakness and headaches.  Endo/Heme/Allergies:  Does not bruise/bleed easily.  Psychiatric/Behavioral:  Negative for depression. The patient is not nervous/anxious and does not have insomnia.      MEDICAL HISTORY:  Past Medical History:  Diagnosis Date   Anxiety    Arthritis    History of hiatal hernia    HLD (hyperlipidemia)    Osteoporosis    Psoriasis    Sinus congestion     SURGICAL HISTORY: Past Surgical History:  Procedure Laterality Date   ANTERIOR CERVICAL DECOMP/DISCECTOMY FUSION N/A 03/13/2019   Procedure: ANTERIOR CERVICAL DECOMPRESSION/DISCECTOMY FUSION 2 LEVELS C5-7;  Surgeon: Venetia Night, MD;  Location: [facility] ORS;  Service: Neurosurgery;  Laterality: N/A;   APPENDECTOMY     CHOLECYSTECTOMY     COLONOSCOPY WITH PROPOFOL N/A 04/12/2022   Procedure: COLONOSCOPY WITH PROPOFOL;  Surgeon: Regis Bill, MD;  Location: [facility] ENDOSCOPY;  Service: Endoscopy;  Laterality: N/A;   EXPLORATORY LAPAROTOMY  1988   removed appendix   GALLBLADDER  SURGERY  1993   NOVASURE ABLATION     THORACOTOMY/LOBECTOMY Left 01/20/2020   Procedure: THORACOTOMY/LOBECTOMY;  Surgeon: Hulda Marin, MD;  Location: [facility] ORS;  Service: Thoracic;  Laterality: Left;    SOCIAL HISTORY: Social History   Socioeconomic History   Marital status: Married    Spouse name: Not on file   Number of children: Not on file   Years of education: Not on file   Highest education level: Not on file  Occupational History   Not on file  Tobacco Use   Smoking status: Former    Packs/day: .25    Types: Cigarettes   Smokeless tobacco: Never  Vaping Use   Vaping Use: Never used  Substance and Sexual Activity   Alcohol use: Yes    Comment: occasionally   Drug use: No   Sexual activity: Never  Other Topics Concern   Not on file  Social History Narrative   Quit smoking- Dec 08, 2019- [started in 50s]; light beer/ wine daily; insurance retd. Lives in husband; lives in [place].    Social Determinants of Health   Financial Resource Strain: Not on file  Food Insecurity: Not on file  Transportation Needs: Not on file  Physical Activity: Not on file  Stress: Not on file  Social Connections: Not on file  Intimate Partner Violence: Not on file    FAMILY HISTORY: Family History  Problem Relation Age of Onset   Breast cancer Paternal Aunt    Hypertension Mother    Hyperlipidemia Mother    Cirrhosis Mother    Heart disease Father    Diabetes Father    Stroke Father     ALLERGIES:  is allergic to meperidine hcl and propoxyphene hcl.  MEDICATIONS:  Current Outpatient Medications  Medication Sig Dispense Refill   ALPRAZolam (XANAX) 0.25 MG tablet Take 1 tablet (0.25 mg total) by mouth 2 (two) times daily as needed. for anxiety (Patient taking differently: Take 0.25 mg by mouth at bedtime. for anxiety) 45 tablet 4   aspirin EC 81 MG tablet Take 81 mg by mouth daily.     atorvastatin (LIPITOR) 80 MG tablet Take 80 mg by mouth daily at 6 PM.      calcium  carbonate (TUMS - DOSED IN MG ELEMENTAL CALCIUM) 500 MG chewable tablet Chew 1-2 tablets by mouth daily as needed for indigestion or heartburn.     cetirizine (ZYRTEC) 10 MG tablet Take 10 mg by mouth daily after breakfast.      Cholecalciferol (VITAMIN D) 50 MCG (2000 UT) tablet Take 2,000 Units by mouth daily with lunch.      clobetasol ointment (TEMOVATE) 0.05 % Apply 1 application topically 2 (two) times daily as needed (psoriasis).      fluticasone (FLONASE) 50 MCG/ACT nasal spray Place 2 sprays into both nostrils daily as needed.      hydrochlorothiazide (HYDRODIURIL) 25 MG tablet Take 25 mg by mouth daily.     montelukast (SINGULAIR) 10 MG tablet Take 10 mg by mouth at bedtime.     Propylene Glycol (SYSTANE COMPLETE) 0.6 % SOLN Apply 1 drop to eye daily as needed. 1 gtt each eye as  needed for dry eyes     Sodium Chloride-Sodium Bicarb (NETI POT SINUS WASH NA) Place 1 Dose into the nose daily as needed (congestion).     No current facility-administered medications for this visit.      Marland Kitchen  PHYSICAL EXAMINATION: ECOG PERFORMANCE STATUS: 0 - Asymptomatic  Vitals:   08/16/22 1422  BP: 123/70  Pulse: 90  Resp: 16  Temp: 98 F (36.7 C)  SpO2: 100%   Filed Weights   08/16/22 1422  Weight: 142 lb (64.4 kg)     Physical Exam HENT:     Head: Normocephalic and atraumatic.     Mouth/Throat:     Pharynx: No oropharyngeal exudate.  Eyes:     Pupils: Pupils are equal, round, and reactive to light.  Cardiovascular:     Rate and Rhythm: Normal rate and regular rhythm.  Pulmonary:     Effort: No respiratory distress.     Breath sounds: No wheezing.  Abdominal:     General: Bowel sounds are normal. There is no distension.     Palpations: Abdomen is soft. There is no mass.     Tenderness: There is no abdominal tenderness. There is no guarding or rebound.  Musculoskeletal:        General: No tenderness. Normal range of motion.     Cervical back: Normal range of motion and neck  supple.  Skin:    General: Skin is warm.  Neurological:     Mental Status: She is alert and oriented to person, place, and time.  Psychiatric:        Mood and Affect: Affect normal.      LABORATORY DATA:  I have reviewed the data as listed Lab Results  Component Value Date   WBC 5.5 08/16/2022   HGB 14.7 08/16/2022   HCT 41.0 08/16/2022   MCV 87.8 08/16/2022   PLT 245 08/16/2022   Recent Labs    02/04/22 1215 02/11/22 1324 08/16/22 1407  NA  --  139 132*  K  --  4.1 3.2*  CL  --  102 93*  CO2  --  29 29  GLUCOSE  --  119* 113*  BUN  --  13 9  CREATININE 0.50 0.58 0.52  CALCIUM  --  9.2 9.2  GFRNONAA  --  >60 >60  PROT  --  7.5 7.9  ALBUMIN  --  4.5 4.6  AST  --  36 34  ALT  --  40 34  ALKPHOS  --  78 97  BILITOT  --  0.8 0.8    RADIOGRAPHIC STUDIES: I have personally reviewed the radiological images as listed and agreed with the findings in the report. CT CHEST WO CONTRAST  Result Date: 08/15/2022 CLINICAL DATA:  Follow-up left upper lobe lung cancer, status post lobectomy EXAM: CT CHEST WITHOUT CONTRAST TECHNIQUE: Multidetector CT imaging of the chest was performed following the standard protocol without IV contrast. RADIATION DOSE REDUCTION: This exam was performed according to the departmental dose-optimization program which includes automated exposure control, adjustment of the mA and/or kV according to patient size and/or use of iterative reconstruction technique. COMPARISON:  02/04/2022 FINDINGS: Cardiovascular: The heart is normal in size. No pericardial effusion. No evidence of thoracic aortic aneurysm. Mild coronary atherosclerosis of the LAD. Mediastinum/Nodes: No suspicious mediastinal lymphadenopathy. Visualized thyroid is unremarkable. Lungs/Pleura: Right apical pleural-parenchymal scarring. Status post left upper lobectomy. No suspicious pulmonary nodules. No focal consolidation. No pleural effusion or pneumothorax. Upper Abdomen: Visualized upper abdomen  is notable for a small hiatal hernia, prior cholecystectomy, vascular calcifications, and a stable 8 mm rim calcified lesion in the posterior left upper kidney (series 2/image 139), incompletely characterized but chronic/unchanged and likely benign. Musculoskeletal: Cervical spine fixation hardware, incompletely visualized. IMPRESSION: Status post left upper lobectomy. No evidence of recurrent or metastatic disease. Electronically Signed   By: Charline Bills M.D.   On: 08/15/2022 17:00     ASSESSMENT & PLAN:   Primary cancer of left upper lobe of lung (HCC) #Left upper lobe lung cancer-adenocarcinoma; stage I.  [sep 2021]; no adjuvant therapy.  APRIL 19th, 2024- tatus post left upper lobectomy. No evidence of recurrent or metastatic disease. Will order CT scan for 6 months; and then annual x 5 years.    #Incidental findings on Imaging CT APRIL 2024-small hiatal hernia, prior cholecystectomy, vascular calcifications, and a stable 8 mm rim calcified lesion in the posterior left upper kidney I reviewed/discussed/counseled the patient.  Recommend follow-up/ with PCP.  # Hypokalemia-  [sec to Anti-HTN; Yetta Barre, [facility]]- discussed re: dietary recommendation. Awaiting evaluation with Dr.Andeson this week.   # DISPOSITION: # Follow up in 6 months- MD;labs- cbc/cmp non-contrast CT chest prior-Dr.B  # I reviewed the blood work- with the patient in detail; also reviewed the imaging independently [as summarized above]; and with the patient in detail.    Cc; dr.Anderson.    All questions were answered. The patient knows to call the clinic with any problems, questions or concerns.    Earna Coder, MD 08/16/2022 3:21 PM

## 2023-02-10 ENCOUNTER — Ambulatory Visit
Admission: RE | Admit: 2023-02-10 | Discharge: 2023-02-10 | Disposition: A | Discharge status: Home / Self Care | Payer: Medicare Other | Source: Ambulatory Visit | Attending: Internal Medicine | Admitting: Internal Medicine

## 2023-02-10 DIAGNOSIS — C3412 Malignant neoplasm of upper lobe, left bronchus or lung: Secondary | ICD-10-CM | POA: Insufficient documentation

## 2023-02-17 ENCOUNTER — Inpatient Hospital Stay (HOSPITAL_BASED_OUTPATIENT_CLINIC_OR_DEPARTMENT_OTHER): Payer: Medicare Other | Admitting: Nurse Practitioner

## 2023-02-17 ENCOUNTER — Inpatient Hospital Stay: Payer: Medicare Other | Attending: Internal Medicine

## 2023-02-17 ENCOUNTER — Inpatient Hospital Stay: Payer: Medicare Other | Admitting: Nurse Practitioner

## 2023-02-17 ENCOUNTER — Encounter: Payer: Self-pay | Admitting: Nurse Practitioner

## 2023-02-17 ENCOUNTER — Inpatient Hospital Stay: Payer: Medicare Other

## 2023-02-17 VITALS — BP 128/69 | HR 78 | Temp 97.9°F | Wt 143.0 lb

## 2023-02-17 DIAGNOSIS — Z08 Encounter for follow-up examination after completed treatment for malignant neoplasm: Secondary | ICD-10-CM | POA: Diagnosis not present

## 2023-02-17 DIAGNOSIS — C3412 Malignant neoplasm of upper lobe, left bronchus or lung: Secondary | ICD-10-CM

## 2023-02-17 DIAGNOSIS — Z87891 Personal history of nicotine dependence: Secondary | ICD-10-CM | POA: Insufficient documentation

## 2023-02-17 DIAGNOSIS — Z85118 Personal history of other malignant neoplasm of bronchus and lung: Secondary | ICD-10-CM | POA: Diagnosis not present

## 2023-02-17 DIAGNOSIS — Z902 Acquired absence of lung [part of]: Secondary | ICD-10-CM | POA: Insufficient documentation

## 2023-02-17 LAB — CBC WITH DIFFERENTIAL (CANCER CENTER ONLY)
Abs Immature Granulocytes: 0.02 10*3/uL (ref 0.00–0.07)
Basophils Absolute: 0 10*3/uL (ref 0.0–0.1)
Basophils Relative: 1 %
Eosinophils Absolute: 0.2 10*3/uL (ref 0.0–0.5)
Eosinophils Relative: 3 %
HCT: 37.4 % (ref 36.0–46.0)
Hemoglobin: 13.3 g/dL (ref 12.0–15.0)
Immature Granulocytes: 0 %
Lymphocytes Relative: 25 %
Lymphs Abs: 1.2 10*3/uL (ref 0.7–4.0)
MCH: 31.7 pg (ref 26.0–34.0)
MCHC: 35.6 g/dL (ref 30.0–36.0)
MCV: 89.3 fL (ref 80.0–100.0)
Monocytes Absolute: 0.5 10*3/uL (ref 0.1–1.0)
Monocytes Relative: 11 %
Neutro Abs: 2.9 10*3/uL (ref 1.7–7.7)
Neutrophils Relative %: 60 %
Platelet Count: 232 10*3/uL (ref 150–400)
RBC: 4.19 MIL/uL (ref 3.87–5.11)
RDW: 11.2 % — ABNORMAL LOW (ref 11.5–15.5)
WBC Count: 4.9 10*3/uL (ref 4.0–10.5)
nRBC: 0 % (ref 0.0–0.2)

## 2023-02-17 LAB — CMP (CANCER CENTER ONLY)
ALT: 33 U/L (ref 0–44)
AST: 32 U/L (ref 15–41)
Albumin: 4.3 g/dL (ref 3.5–5.0)
Alkaline Phosphatase: 82 U/L (ref 38–126)
Anion gap: 8 (ref 5–15)
BUN: 10 mg/dL (ref 8–23)
CO2: 27 mmol/L (ref 22–32)
Calcium: 8.7 mg/dL — ABNORMAL LOW (ref 8.9–10.3)
Chloride: 97 mmol/L — ABNORMAL LOW (ref 98–111)
Creatinine: 0.58 mg/dL (ref 0.44–1.00)
GFR, Estimated: >60 mL/min (ref >60)
Glucose, Bld: 121 mg/dL — ABNORMAL HIGH (ref 70–99)
Potassium: 3.3 mmol/L — ABNORMAL LOW (ref 3.5–5.1)
Sodium: 132 mmol/L — ABNORMAL LOW (ref 135–145)
Total Bilirubin: 0.7 mg/dL (ref 0.3–1.2)
Total Protein: 7.1 g/dL (ref 6.5–8.1)

## 2023-02-17 NOTE — Progress Notes (Signed)
[facility] CONSULT NOTE  Patient Care Team: Lauro Regulus, MD as PCP - General (Internal Medicine) Glory Buff, RN as Oncology Nurse Navigator Earna Coder, MD as Consulting Physician (Internal Medicine)  CHIEF COMPLAINTS/PURPOSE OF CONSULTATION: lung cancer  Oncology History Overview Note   #SEP 2021- Left upper lobe nodule- 1.3 x 1.8 x 2.1 cm-spiculated abutting major fissure-highly concerning for primary lung cancer; 7 mm triangular subpleural nodule-question benign; SEP 27th 2021- s/p LOBECTOMY- pT1cN0- STAGE I- adenocarcinoma- Surveillaince  #Smoking-10 years [quit August 2021]; neck surgery [November 2020 Dr. 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  Visceral Pleura Invasion: Not identified  Lymphovascular Invasion: Not identified  Direct Invasion of Adjacent Structures: No adjacent structures present  Margins: All margins uninvolved by tumor  Margins examined: Bronchial, vascular, and parenchymal  Regional Lymph Nodes: Number of Lymph Nodes Involved: 0  Number of Lymph Nodes Examined: 5   Treatment Effect: No known presurgical therapy  Pathologic Stage Classification (pTNM, AJCC 8th Edition): pT1c pN0   # SURVIVORSHIP:   # GENETICS:   DIAGNOSIS: Lung cancer  STAGE:  I       ;  GOALS: cure  CURRENT/MOST RECENT THERAPY : surveillaince    Primary cancer of left upper lobe of lung (HCC)  02/10/2020 Initial Diagnosis   Primary cancer of left upper lobe of lung (HCC)    HISTORY OF PRESENTING ILLNESS: Ambulating independently. Alone.   Tabitha Knight 67 y.o.  female stage I lung cancer left upper lobe s/p surgery is here to review the results of her surveillance CT  imaging. She denies shortness of breath or chest pain. Denies losing weight without trying. COntinues to stay active. Denies headaches.   Review of Systems  Constitutional:  Negative for chills, diaphoresis, fever, malaise/fatigue and weight loss.  HENT:  Negative for nosebleeds and sore throat.   Eyes:  Negative for double vision.  Respiratory:  Negative for cough, hemoptysis, sputum production, shortness of breath and wheezing.   Cardiovascular:  Negative for chest pain, palpitations, orthopnea and leg swelling.  Gastrointestinal:  Negative for abdominal pain, blood in stool, constipation, diarrhea, heartburn, melena, nausea and vomiting.  Genitourinary:  Negative for dysuria, frequency and urgency.  Musculoskeletal:  Negative for back pain and joint pain.  Skin: Negative.  Negative for itching and rash.  Neurological:  Negative for dizziness, tingling, focal weakness, weakness and headaches.  Endo/Heme/Allergies:  Does not bruise/bleed easily.  Psychiatric/Behavioral:  Negative for depression. The patient is not nervous/anxious and does not have insomnia.      MEDICAL HISTORY:  Past Medical History:  Diagnosis Date   Anxiety    Arthritis    History of hiatal hernia    HLD (hyperlipidemia)    Osteoporosis    Psoriasis    Sinus congestion     SURGICAL HISTORY: Past Surgical History:  Procedure Laterality Date   ANTERIOR CERVICAL DECOMP/DISCECTOMY FUSION N/A 03/13/2019   Procedure: ANTERIOR CERVICAL DECOMPRESSION/DISCECTOMY FUSION 2 LEVELS C5-7;  Surgeon: Venetia Night, MD;  Location: [facility] ORS;  Service: Neurosurgery;  Laterality: N/A;   APPENDECTOMY     CHOLECYSTECTOMY  COLONOSCOPY WITH PROPOFOL N/A 04/12/2022   Procedure: COLONOSCOPY WITH PROPOFOL;  Surgeon: Regis Bill, MD;  Location: [facility];  Service: Endoscopy;  Laterality: N/A;   EXPLORATORY LAPAROTOMY  1988   removed appendix   GALLBLADDER SURGERY  1993   NOVASURE ABLATION      THORACOTOMY/LOBECTOMY Left 01/20/2020   Procedure: THORACOTOMY/LOBECTOMY;  Surgeon: Hulda Marin, MD;  Location: [facility] ORS;  Service: Thoracic;  Laterality: Left;    SOCIAL HISTORY: Social History   Socioeconomic History   Marital status: Married    Spouse name: Not on file   Number of children: Not on file   Years of education: Not on file   Highest education level: Not on file  Occupational History   Not on file  Tobacco Use   Smoking status: Former    Current packs/day: 0.25    Types: Cigarettes   Smokeless tobacco: Never  Vaping Use   Vaping status: Never Used  Substance and Sexual Activity   Alcohol use: Yes    Comment: occasionally   Drug use: No   Sexual activity: Never  Other Topics Concern   Not on file  Social History Narrative   Quit smoking- Dec 08, 2019- [started in 50s]; light beer/ wine daily; insurance retd. Lives in husband; lives in [place].    Social Determinants of Health   Financial Resource Strain: Not on file  Food Insecurity: Not on file  Transportation Needs: Not on file  Physical Activity: Not on file  Stress: Not on file  Social Connections: Not on file  Intimate Partner Violence: Not on file    FAMILY HISTORY: Family History  Problem Relation Age of Onset   Breast cancer Paternal Aunt    Hypertension Mother    Hyperlipidemia Mother    Cirrhosis Mother    Heart disease Father    Diabetes Father    Stroke Father     ALLERGIES:  is allergic to meperidine hcl and propoxyphene hcl.  MEDICATIONS:  Current Outpatient Medications  Medication Sig Dispense Refill   ALPRAZolam (XANAX) 0.25 MG tablet Take 1 tablet (0.25 mg total) by mouth 2 (two) times daily as needed. for anxiety (Patient taking differently: Take 0.25 mg by mouth at bedtime. for anxiety) 45 tablet 4   aspirin EC 81 MG tablet Take 81 mg by mouth daily.     atorvastatin (LIPITOR) 80 MG tablet Take 80 mg by mouth daily at 6 PM.      calcium carbonate (TUMS - DOSED IN MG  ELEMENTAL CALCIUM) 500 MG chewable tablet Chew 1-2 tablets by mouth daily as needed for indigestion or heartburn.     cetirizine (ZYRTEC) 10 MG tablet Take 10 mg by mouth daily after breakfast.      Cholecalciferol (VITAMIN D) 50 MCG (2000 UT) tablet Take 2,000 Units by mouth daily with lunch.      clobetasol ointment (TEMOVATE) 0.05 % Apply 1 application topically 2 (two) times daily as needed (psoriasis).      fluticasone (FLONASE) 50 MCG/ACT nasal spray Place 2 sprays into both nostrils daily as needed.      losartan-hydrochlorothiazide (HYZAAR) 100-12.5 MG tablet      montelukast (SINGULAIR) 10 MG tablet Take 10 mg by mouth at bedtime.     pantoprazole (PROTONIX) 40 MG tablet Take by mouth.     Propylene Glycol (SYSTANE COMPLETE) 0.6 % SOLN Apply 1 drop to eye daily as needed. 1 gtt each eye as needed for dry eyes     Sodium  Chloride-Sodium Bicarb (NETI POT SINUS WASH NA) Place 1 Dose into the nose daily as needed (congestion).     hydrochlorothiazide (HYDRODIURIL) 25 MG tablet Take 25 mg by mouth daily.     No current facility-administered medications for this visit.    PHYSICAL EXAMINATION: ECOG PERFORMANCE STATUS: 0 - Asymptomatic Vitals:   02/17/23 1040  BP: 128/69  Pulse: 78  Temp: 97.9 F (36.6 C)  SpO2: 100%   Filed Weights   02/17/23 1040  Weight: 143 lb (64.9 kg)   Physical Exam Constitutional:      Appearance: She is not ill-appearing.  Eyes:     General: No scleral icterus.    Conjunctiva/sclera: Conjunctivae normal.  Cardiovascular:     Rate and Rhythm: Normal rate and regular rhythm.  Abdominal:     General: There is no distension.     Palpations: Abdomen is soft.     Tenderness: There is no abdominal tenderness. There is no guarding.  Musculoskeletal:        General: No deformity.     Right lower leg: No edema.     Left lower leg: No edema.  Lymphadenopathy:     Cervical: No cervical adenopathy.  Skin:    General: Skin is warm and dry.  Neurological:      Mental Status: She is alert and oriented to person, place, and time. Mental status is at baseline.  Psychiatric:        Mood and Affect: Mood normal.        Behavior: Behavior normal.    LABORATORY DATA:  I have reviewed the data as listed Lab Results  Component Value Date   WBC 4.9 02/17/2023   HGB 13.3 02/17/2023   HCT 37.4 02/17/2023   MCV 89.3 02/17/2023   PLT 232 02/17/2023   Recent Labs    08/16/22 1407 02/17/23 1025  NA 132* 132*  K 3.2* 3.3*  CL 93* 97*  CO2 29 27  GLUCOSE 113* 121*  BUN 9 10  CREATININE 0.52 0.58  CALCIUM 9.2 8.7*  GFRNONAA >60 >60  PROT 7.9 7.1  ALBUMIN 4.6 4.3  AST 34 32  ALT 34 33  ALKPHOS 97 82  BILITOT 0.8 0.7    RADIOGRAPHIC STUDIES: I have personally reviewed the radiological images as listed and agreed with the findings in the report. CT CHEST WO CONTRAST  Result Date: 02/17/2023 CLINICAL DATA:  Non-small cell lung cancer with history of left upper lobectomy in 2021. No current symptoms. * Tracking Code: BO * EXAM: CT CHEST WITHOUT CONTRAST TECHNIQUE: Multidetector CT imaging of the chest was performed following the standard protocol without IV contrast. RADIATION DOSE REDUCTION: This exam was performed according to the departmental dose-optimization program which includes automated exposure control, adjustment of the mA and/or kV according to patient size and/or use of iterative reconstruction technique. COMPARISON:  Chest CT 08/12/2022 and 02/04/2022 FINDINGS: Cardiovascular: Minimal atherosclerosis of the aorta, great vessels and coronary arteries. The heart size is normal. There is no pericardial effusion. Mediastinum/Nodes: There are no enlarged mediastinal, hilar or axillary lymph nodes.Hilar assessment is limited by the lack of intravenous contrast, although the hilar contours appear unchanged. Stable small to moderate hiatal hernia. The thyroid gland appears unremarkable. Lungs/Pleura: No pleural effusion or pneumothorax. Stable  postsurgical changes from previous left upper lobectomy. Unchanged scarring at the right lung apex and peripherally in the right middle lobe. No suspicious pulmonary nodules. Upper abdomen: The visualized upper abdomen appears stable, without suspicious findings. Patient is  status post cholecystectomy. There is a stable 8 mm rim calcified lesion in the upper pole of the left kidney, consistent with a benign lesion based on stability. Musculoskeletal/Chest wall: There is no chest wall mass or suspicious osseous finding. Previous lower cervical fusion. Unless specific follow-up recommendations are mentioned in the findings or impression sections, no imaging follow-up of any mentioned incidental findings is recommended. IMPRESSION: 1. Stable postsurgical changes from previous left upper lobectomy. No evidence of local recurrence or metastatic disease. 2. Stable scarring at the right lung apex and peripherally in the right middle lobe. 3. Stable small to moderate hiatal hernia. 4.  Aortic Atherosclerosis (ICD10-I70.0). Electronically Signed   By: Carey Bullocks M.D.   On: 02/17/2023 10:15     ASSESSMENT & PLAN:   # Left upper lobe lung cancer- adenocarcinoma stage I. [sep 2021] no adjuvant therapy.  APRIL 19th, 2024 - status post left upper lobectomy. No evidence of recurrent or metastatic disease. 01/2023 CT Chest wo contrast reviewed and stable post surgical changes. No evidence of recurrent or metastatic disease. Plan to repeat annually x 5 years.    # Incidental findings on Imaging CT APRIL 2024-small hiatal hernia, prior cholecystectomy, vascular calcifications, and a stable 8 mm rim calcified lesion in the posterior left upper kidney- stable. Recommend follow-up/ with PCP.   # Hypokalemia-  [sec to Anti-HTN; Yetta Barre, [facility]]- discussed re: dietary recommendation. Awaiting evaluation with Dr. Dareen Piano this week.    DISPOSITION: Follow up in 6 months- labs- cbc/cmp, Dr Donneta Romberg - [place]  No problem-specific  Assessment & Plan notes found for this encounter.  All questions were answered. The patient knows to call the clinic with any problems, questions or concerns.   Alinda Dooms, NP 02/17/2023

## 2023-05-23 ENCOUNTER — Other Ambulatory Visit: Payer: Self-pay | Admitting: Internal Medicine

## 2023-05-23 DIAGNOSIS — Z1231 Encounter for screening mammogram for malignant neoplasm of breast: Secondary | ICD-10-CM

## 2023-05-30 ENCOUNTER — Ambulatory Visit
Admission: RE | Admit: 2023-05-30 | Discharge: 2023-05-30 | Disposition: A | Discharge status: Home / Self Care | Payer: Medicare Other | Source: Ambulatory Visit | Attending: Internal Medicine | Admitting: Internal Medicine

## 2023-05-30 DIAGNOSIS — Z1231 Encounter for screening mammogram for malignant neoplasm of breast: Secondary | ICD-10-CM | POA: Insufficient documentation

## 2023-08-01 ENCOUNTER — Telehealth: Payer: Self-pay | Admitting: Internal Medicine

## 2023-08-01 NOTE — Telephone Encounter (Signed)
 Pt wants to know if she is supposed to have a CT before seeing Dr.B. she said she has CT every 6 months. I looked at the LOS from [facility] [place] and there is no mention of a CT, there is no CT order in active requests. I told pt that she could talk to Dr. B when she sees him in clinic but she wants a message to be sent now. Pt stated she prefers [facility] for CT as well. Please advise and call pt back to let her know the update.

## 2023-08-18 ENCOUNTER — Inpatient Hospital Stay (HOSPITAL_BASED_OUTPATIENT_CLINIC_OR_DEPARTMENT_OTHER): Payer: Medicare Other | Admitting: Internal Medicine

## 2023-08-18 ENCOUNTER — Inpatient Hospital Stay: Payer: Medicare Other | Attending: Internal Medicine

## 2023-08-18 ENCOUNTER — Encounter: Payer: Self-pay | Admitting: Internal Medicine

## 2023-08-18 ENCOUNTER — Other Ambulatory Visit: Payer: Self-pay | Admitting: *Deleted

## 2023-08-18 VITALS — BP 133/65 | HR 76 | Temp 97.5°F | Resp 18 | Wt 140.0 lb

## 2023-08-18 DIAGNOSIS — Z122 Encounter for screening for malignant neoplasm of respiratory organs: Secondary | ICD-10-CM | POA: Diagnosis not present

## 2023-08-18 DIAGNOSIS — C3412 Malignant neoplasm of upper lobe, left bronchus or lung: Secondary | ICD-10-CM

## 2023-08-18 DIAGNOSIS — E876 Hypokalemia: Secondary | ICD-10-CM | POA: Insufficient documentation

## 2023-08-18 DIAGNOSIS — Z08 Encounter for follow-up examination after completed treatment for malignant neoplasm: Secondary | ICD-10-CM

## 2023-08-18 DIAGNOSIS — Z79899 Other long term (current) drug therapy: Secondary | ICD-10-CM | POA: Diagnosis not present

## 2023-08-18 DIAGNOSIS — K449 Diaphragmatic hernia without obstruction or gangrene: Secondary | ICD-10-CM | POA: Insufficient documentation

## 2023-08-18 DIAGNOSIS — Z87891 Personal history of nicotine dependence: Secondary | ICD-10-CM | POA: Diagnosis not present

## 2023-08-18 DIAGNOSIS — Z7982 Long term (current) use of aspirin: Secondary | ICD-10-CM | POA: Insufficient documentation

## 2023-08-18 DIAGNOSIS — Z902 Acquired absence of lung [part of]: Secondary | ICD-10-CM | POA: Insufficient documentation

## 2023-08-18 LAB — CBC WITH DIFFERENTIAL (CANCER CENTER ONLY)
Abs Immature Granulocytes: 0.01 10*3/uL (ref 0.00–0.07)
Basophils Absolute: 0 10*3/uL (ref 0.0–0.1)
Basophils Relative: 1 %
Eosinophils Absolute: 0.1 10*3/uL (ref 0.0–0.5)
Eosinophils Relative: 1 %
HCT: 36.1 % (ref 36.0–46.0)
Hemoglobin: 12.9 g/dL (ref 12.0–15.0)
Immature Granulocytes: 0 %
Lymphocytes Relative: 25 %
Lymphs Abs: 1.2 10*3/uL (ref 0.7–4.0)
MCH: 31.2 pg (ref 26.0–34.0)
MCHC: 35.7 g/dL (ref 30.0–36.0)
MCV: 87.4 fL (ref 80.0–100.0)
Monocytes Absolute: 0.4 10*3/uL (ref 0.1–1.0)
Monocytes Relative: 9 %
Neutro Abs: 3.1 10*3/uL (ref 1.7–7.7)
Neutrophils Relative %: 64 %
Platelet Count: 230 10*3/uL (ref 150–400)
RBC: 4.13 MIL/uL (ref 3.87–5.11)
RDW: 11.3 % — ABNORMAL LOW (ref 11.5–15.5)
WBC Count: 4.9 10*3/uL (ref 4.0–10.5)
nRBC: 0 % (ref 0.0–0.2)

## 2023-08-18 LAB — CMP (CANCER CENTER ONLY)
ALT: 37 U/L (ref 0–44)
AST: 34 U/L (ref 15–41)
Albumin: 4.3 g/dL (ref 3.5–5.0)
Alkaline Phosphatase: 78 U/L (ref 38–126)
Anion gap: 9 (ref 5–15)
BUN: 9 mg/dL (ref 8–23)
CO2: 27 mmol/L (ref 22–32)
Calcium: 8.9 mg/dL (ref 8.9–10.3)
Chloride: 96 mmol/L — ABNORMAL LOW (ref 98–111)
Creatinine: 0.54 mg/dL (ref 0.44–1.00)
GFR, Estimated: >60 mL/min (ref >60)
Glucose, Bld: 108 mg/dL — ABNORMAL HIGH (ref 70–99)
Potassium: 3.9 mmol/L (ref 3.5–5.1)
Sodium: 132 mmol/L — ABNORMAL LOW (ref 135–145)
Total Bilirubin: 0.7 mg/dL (ref 0.0–1.2)
Total Protein: 7.1 g/dL (ref 6.5–8.1)

## 2023-08-18 NOTE — Progress Notes (Signed)
 [facility] CONSULT NOTE  Patient Care Team: Jimmy Moulding, MD as PCP - General (Internal Medicine) Drake Gens, RN as Oncology Nurse Navigator Gwyn Leos, MD as Consulting Physician (Internal Medicine)  CHIEF COMPLAINTS/PURPOSE OF CONSULTATION: lung cancer  Oncology History Overview Note   #SEP 2021- Left upper lobe nodule- 1.3 x 1.8 x 2.1 cm-spiculated abutting major fissure-highly concerning for primary lung cancer; 7 mm triangular subpleural nodule-question benign; SEP 27th 2021- s/p LOBECTOMY- pT1cN0- STAGE I- adenocarcinoma- Surveillaince  #Smoking-10 years [quit August 2021]; neck surgery [November 2020 Dr. 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  Visceral Pleura Invasion: Not identified  Lymphovascular Invasion: Not identified  Direct Invasion of Adjacent Structures: No adjacent structures present  Margins: All margins uninvolved by tumor  Margins examined: Bronchial, vascular, and parenchymal  Regional Lymph Nodes: Number of Lymph Nodes Involved: 0  Number of Lymph Nodes Examined: 5   Treatment Effect: No known presurgical therapy  Pathologic Stage Classification (pTNM, AJCC 8th Edition): pT1c pN0   # SURVIVORSHIP:   # GENETICS:   DIAGNOSIS: Lung cancer  STAGE:  I       ;  GOALS: cure  CURRENT/MOST RECENT THERAPY : surveillaince    Primary cancer of left upper lobe of lung (HCC)  02/10/2020 Initial Diagnosis   Primary cancer of left upper lobe of lung (HCC)     HISTORY OF PRESENTING ILLNESS: Ambulating independently. Alone.   Tabitha Knight 68 y.o.  female stage I lung cancer left upper lobe s/p surgery on surveillance is here for a follow up..   Patient  denies new problems/concerns today. Denies any pain.  Any shortness of breath or cough.  Still playing golf/pickle ball.    Nontender.  No masses felt.  No nausea no vomiting.  No headaches.   Review of Systems  Constitutional:  Negative for chills, diaphoresis, fever, malaise/fatigue and weight loss.  HENT:  Negative for nosebleeds and sore throat.   Eyes:  Negative for double vision.  Respiratory:  Negative for cough, hemoptysis, sputum production, shortness of breath and wheezing.   Cardiovascular:  Negative for chest pain, palpitations, orthopnea and leg swelling.  Gastrointestinal:  Negative for abdominal pain, blood in stool, constipation, diarrhea, heartburn, melena, nausea and vomiting.  Genitourinary:  Negative for dysuria, frequency and urgency.  Musculoskeletal:  Negative for back pain and joint pain.  Skin: Negative.  Negative for itching and rash.  Neurological:  Negative for dizziness, tingling, focal weakness, weakness and headaches.  Endo/Heme/Allergies:  Does not bruise/bleed easily.  Psychiatric/Behavioral:  Negative for depression. The patient is not nervous/anxious and does not have insomnia.      MEDICAL HISTORY:  Past Medical History:  Diagnosis Date   Anxiety    Arthritis    History of hiatal hernia    HLD (hyperlipidemia)    Osteoporosis    Psoriasis    Sinus congestion     SURGICAL HISTORY: Past Surgical History:  Procedure Laterality Date   ANTERIOR CERVICAL DECOMP/DISCECTOMY FUSION N/A 03/13/2019   Procedure: ANTERIOR CERVICAL DECOMPRESSION/DISCECTOMY FUSION 2 LEVELS C5-7;  Surgeon: Jodeen Munch, MD;  Location: [facility] OR[facility];  Service: Neurosurgery;  Laterality: N/A;   APPENDECTOMY     CHOLECYSTECTOMY     COLONOSCOPY WITH PROPOFOL  N/A 04/12/2022   Procedure: COLONOSCOPY WITH PROPOFOL ;  Surgeon: Shane Darling, MD;  Location: [facility];  Service: Endoscopy;  Laterality: N/A;   EXPLORATORY LAPAROTOMY  1988   removed appendix   GALLBLADDER  SURGERY  1993   NOVASURE ABLATION     THORACOTOMY/LOBECTOMY Left 01/20/2020   Procedure: THORACOTOMY/LOBECTOMY;  Surgeon: Petra Brandy, MD;  Location: [facility] OR[facility];  Service: Thoracic;  Laterality: Left;    SOCIAL HISTORY: Social History   Socioeconomic History   Marital status: Married    Spouse name: Not on file   Number of children: Not on file   Years of education: Not on file   Highest education level: Not on file  Occupational History   Not on file  Tobacco Use   Smoking status: Former    Current packs/day: 0.25    Average packs/day: 0.3 packs/day for 10.0 years (2.5 ttl pk-yrs)    Types: Cigarettes    Start date: 08/17/2013   Smokeless tobacco: Never  Vaping Use   Vaping status: Never Used  Substance and Sexual Activity   Alcohol use: Yes    Comment: occasionally   Drug use: No   Sexual activity: Never  Other Topics Concern   Not on file  Social History Narrative   Quit smoking- Dec 08, 2019- [started in 50s]; light beer/ wine daily; 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    Social Drivers of Corporate investment banker Strain: Low Risk  (02/21/2023)   Received from [facility]   Overall Financial Resource Strain (CARDIA)    Difficulty of Paying Living Expenses: Not hard at all  Food Insecurity: No Food Insecurity (02/21/2023)   Received from [facility]   Hunger Vital Sign    Worried About Running Out of Food in the Last Year: Never true    Ran Out of Food in the Last Year: Never true  Transportation Needs: No Transportation Needs (02/21/2023)   Received from [facility]   PRAPARE - Transportation    Lack of Transportation (Non-Medical): No    In the past 12 months, has lack of transportation kept you from medical appointments or from getting medications?: No  Physical Activity: Not on file  Stress: Not on file  Social Connections: Not on file  Intimate Partner Violence: Not on file     FAMILY HISTORY: Family History  Problem Relation Age of Onset   Breast cancer Paternal Aunt    Hypertension Mother    Hyperlipidemia Mother    Cirrhosis Mother    Heart disease Father    Diabetes Father    Stroke Father     ALLERGIES:  is allergic to meperidine hcl and propoxyphene hcl.  MEDICATIONS:  Current Outpatient Medications  Medication Sig Dispense Refill   ALPRAZolam  (XANAX ) 0.25 MG tablet Take 1 tablet (0.25 mg total) by mouth 2 (two) times daily as needed. for anxiety (Patient taking differently: Take 0.25 mg by mouth at bedtime. for anxiety) 45 tablet 4   aspirin EC 81 MG tablet Take 81 mg by mouth daily.     atorvastatin  (LIPITOR ) 80 MG tablet Take 80 mg by mouth daily at 6 PM.      azelastine (ASTELIN) 0.1 % nasal spray Place 2 sprays into the nose.     calcium  carbonate (TUMS - DOSED IN MG ELEMENTAL CALCIUM ) 500  MG chewable tablet Chew 1-2 tablets by mouth daily as needed for indigestion or heartburn.     cetirizine (ZYRTEC) 10 MG tablet Take 10 mg by mouth daily after breakfast.      Cholecalciferol  (VITAMIN D) 50 MCG (2000 UT) tablet Take 2,000 Units by mouth daily with lunch.      clobetasol ointment (TEMOVATE) 0.05 % Apply 1 application topically 2 (two) times daily as needed (psoriasis).      EPINEPHrine  0.3 mg/0.3 mL IJ SOAJ injection Inject 0.3 mLs into the muscle as needed.     fluticasone  (FLONASE ) 50 MCG/ACT nasal spray Place 2 sprays into both nostrils daily as needed.      losartan-hydrochlorothiazide (HYZAAR) 100-12.5 MG tablet      montelukast (SINGULAIR) 10 MG tablet Take 10 mg by mouth at bedtime.     Multiple Vitamin (MULTI-VITAMIN) tablet Take 1 tablet by mouth daily.     pantoprazole (PROTONIX) 40 MG tablet Take by mouth.     Propylene Glycol (SYSTANE COMPLETE) 0.6 % SOLN Apply 1 drop to eye daily as needed. 1 gtt each eye as needed for dry eyes     Sodium Chloride -Sodium Bicarb (NETI POT SINUS WASH NA) Place 1 Dose into the nose daily as needed  (congestion).     hydrochlorothiazide (HYDRODIURIL) 25 MG tablet Take 25 mg by mouth daily.     No current facility-administered medications for this visit.      Aaron Aas  PHYSICAL EXAMINATION: ECOG PERFORMANCE STATUS: 0 - Asymptomatic  Vitals:   08/18/23 1301  BP: 133/65  Pulse: 76  Resp: 18  Temp: (!) 97.5 F (36.4 C)  SpO2: 100%   Filed Weights   08/18/23 1301  Weight: 140 lb (63.5 kg)     Physical Exam HENT:     Head: Normocephalic and atraumatic.     Mouth/Throat:     Pharynx: No oropharyngeal exudate.  Eyes:     Pupils: Pupils are equal, round, and reactive to light.  Cardiovascular:     Rate and Rhythm: Normal rate and regular rhythm.  Pulmonary:     Effort: No respiratory distress.     Breath sounds: No wheezing.  Abdominal:     General: Bowel sounds are normal. There is no distension.     Palpations: Abdomen is soft. There is no mass.     Tenderness: There is no abdominal tenderness. There is no guarding or rebound.  Musculoskeletal:        General: No tenderness. Normal range of motion.     Cervical back: Normal range of motion and neck supple.  Skin:    General: Skin is warm.  Neurological:     Mental Status: She is alert and oriented to person, place, and time.  Psychiatric:        Mood and Affect: Affect normal.      LABORATORY DATA:  I have reviewed the data as listed Lab Results  Component Value Date   WBC 4.9 08/18/2023   HGB 12.9 08/18/2023   HCT 36.1 08/18/2023   MCV 87.4 08/18/2023   PLT 230 08/18/2023   Recent Labs    02/17/23 1025 08/18/23 1246  NA 132* 132*  K 3.3* 3.9  CL 97* 96*  CO2 27 27  GLUCOSE 121* 108*  BUN 10 9  CREATININE 0.58 0.54  CALCIUM  8.7* 8.9  GFRNONAA >60 >60  PROT 7.1 7.1  ALBUMIN 4.3 4.3  AST 32 34  ALT 33 37  ALKPHOS 82 78  BILITOT  0.7 0.7    RADIOGRAPHIC STUDIES: I have personally reviewed the radiological images as listed and agreed with the findings in the report. No results  found.   ASSESSMENT & PLAN:   Primary cancer of left upper lobe of lung (HCC) #Left upper lobe lung cancer-adenocarcinoma; stage I.  [sep 2021]; no adjuvant therapy.  OCT, 2024- status post left upper lobectomy. No evidence of recurrent or metastatic disease. Recommend continue surveillance annual- low dose CT x 5 years. Informed Hayley.   # Hypokalemia-  [sec to Anti-HTN; Rochelle Chu, [facility]]- discussed re: dietary recommendation. Improved-   # #Incidental findings on Imaging CT OCT  2024-small hiatal hernia, prior cholecystectomy, vascular calcifications, and a stable 8 mm rim calcified lesion in the posterior left upper kidney I reviewed/discussed/counseled the patient.  Recommend follow-up/ with PCP.  CT LDCT- hayley  # DISPOSITION: # Follow up in 6 months- MD;labs- cbc/cmp- -Dr.B  # I reviewed the blood work- with the patient in detail; also reviewed the imaging independently [as summarized above]; and with the patient in detail.    Cc; dr.Anderson.     All questions were answered. The patient knows to call the clinic with any problems, questions or concerns.    Gwyn Leos, MD 08/18/2023 1:51 PM

## 2023-08-18 NOTE — Progress Notes (Signed)
 Patient here for follow up. Denies any concerns today.

## 2023-08-18 NOTE — Assessment & Plan Note (Addendum)
#  Left upper lobe lung cancer-adenocarcinoma; stage I.  [sep 2021]; no adjuvant therapy.  OCT, 2024- status post left upper lobectomy. No evidence of recurrent or metastatic disease. Recommend continue surveillance annual- low dose CT x 5 years. Informed Hayley.   # Hypokalemia-  [sec to Anti-HTN; Rochelle Chu, [facility]]- discussed re: dietary recommendation. Improved-   # #Incidental findings on Imaging CT OCT  2024-small hiatal hernia, prior cholecystectomy, vascular calcifications, and a stable 8 mm rim calcified lesion in the posterior left upper kidney I reviewed/discussed/counseled the patient.  Recommend follow-up/ with PCP.  CT LDCT- hayley  # DISPOSITION: # Follow up in 6 months- MD;labs- cbc/cmp- -Dr.B  # I reviewed the blood work- with the patient in detail; also reviewed the imaging independently [as summarized above]; and with the patient in detail.    Cc; dr.Anderson.

## 2023-08-18 NOTE — Progress Notes (Signed)
 Pt does not meet criteria for LDCT. Order for ct chest wo contrast entered by Dr. Valentine Gasmen.

## 2024-01-29 ENCOUNTER — Telehealth: Payer: Self-pay | Admitting: Nurse Practitioner

## 2024-01-29 NOTE — Telephone Encounter (Signed)
 Called pt to inform of the time change for appts on 10/23. Pt scheduled with Tinnie Dawn but provider will not be here this day. The time changed from 10:15 start time to 9:45 start time to be seen by another NP.  Someone answered the phone and then hung up.  I have mailed out AVS with new times.

## 2024-02-01 ENCOUNTER — Ambulatory Visit
Admission: RE | Admit: 2024-02-01 | Discharge: 2024-02-01 | Disposition: A | Discharge status: Home / Self Care | Source: Ambulatory Visit | Attending: Internal Medicine | Admitting: Internal Medicine

## 2024-02-01 DIAGNOSIS — C3412 Malignant neoplasm of upper lobe, left bronchus or lung: Secondary | ICD-10-CM | POA: Insufficient documentation

## 2024-02-13 ENCOUNTER — Ambulatory Visit: Admitting: Internal Medicine

## 2024-02-13 ENCOUNTER — Other Ambulatory Visit

## 2024-02-14 NOTE — Progress Notes (Unsigned)
 PROGRESS NOTE  Patient Care Team: Lenon Layman ORN, MD as PCP - General (Internal Medicine) Verdene Gills, RN as Oncology Nurse Navigator Rennie Cindy SAUNDERS, MD as Consulting Physician (Internal Medicine)    CHIEF COMPLAINTS/PURPOSE OF CONSULTATION:  Lung cancer  Oncology History Overview Note   #SEP 2021- Left upper lobe nodule- 1.3 x 1.8 x 2.1 cm-spiculated abutting major fissure-highly concerning for primary lung cancer; 7 mm triangular subpleural nodule-question benign; SEP 27th 2021- s/p LOBECTOMY- pT1cN0- STAGE I- adenocarcinoma- Surveillaince  #Smoking-10 years [quit August 2021]; neck surgery [November 2020 Dr. 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  Visceral Pleura Invasion: Not identified  Lymphovascular Invasion: Not identified  Direct Invasion of Adjacent Structures: No adjacent structures present  Margins: All margins uninvolved by tumor  Margins examined: Bronchial, vascular, and parenchymal  Regional Lymph Nodes: Number of Lymph Nodes Involved: 0  Number of Lymph Nodes Examined: 5   Treatment Effect: No known presurgical therapy  Pathologic Stage Classification (pTNM, AJCC 8th Edition): pT1c pN0   # SURVIVORSHIP:   # GENETICS:   DIAGNOSIS: Lung cancer  STAGE:  I       ;  GOALS: cure  CURRENT/MOST RECENT THERAPY : surveillaince    Primary cancer of left upper lobe of lung (HCC)  02/10/2020 Initial Diagnosis   Primary cancer of left upper lobe of lung (HCC)      INTERVAL HISTORY: Tabitha Knight is a 68 y.o. female who presents to the clinic for a surveillance visit for history of stage 1 lung cancer.   On exam today ***  MEDICAL HISTORY:  Past Medical History:   Diagnosis Date   Anxiety    Arthritis    History of hiatal hernia    HLD (hyperlipidemia)    Osteoporosis    Psoriasis    Sinus congestion     SURGICAL HISTORY: Past Surgical History:  Procedure Laterality Date   ANTERIOR CERVICAL DECOMP/DISCECTOMY FUSION N/A 03/13/2019   Procedure: ANTERIOR CERVICAL DECOMPRESSION/DISCECTOMY FUSION 2 LEVELS C5-7;  Surgeon: Clois Fret, MD;  Location: [facility];  Service: Neurosurgery;  Laterality: N/A;   APPENDECTOMY     CHOLECYSTECTOMY     COLONOSCOPY WITH PROPOFOL  N/A 04/12/2022   Procedure: COLONOSCOPY WITH PROPOFOL ;  Surgeon: Maryruth Ole DASEN, MD;  Location: [facility];  Service: Endoscopy;  Laterality: N/A;   EXPLORATORY LAPAROTOMY  1988   removed appendix   GALLBLADDER SURGERY  1993   NOVASURE ABLATION     THORACOTOMY/LOBECTOMY Left 01/20/2020   Procedure: THORACOTOMY/LOBECTOMY;  Surgeon: Volney Lye, MD;  Location: [facility];  Service: Thoracic;  Laterality: Left;    SOCIAL HISTORY: Social History   Socioeconomic History   Marital status: Married    Spouse name: Not on file   Number of children: Not on file   Years of education: Not on file   Highest education level: Not on file  Occupational History   Not on file  Tobacco Use   Smoking status: Former    Current packs/day: 0.25    Average packs/day: 0.3 packs/day for 10.5 years (2.6 ttl pk-yrs)    Types: Cigarettes    Start  date: 08/17/2013   Smokeless tobacco: Never  Vaping Use   Vaping status: Never Used  Substance and Sexual Activity   Alcohol use: Yes    Comment: occasionally   Drug use: No   Sexual activity: Never  Other Topics Concern   Not on file  Social History Narrative   Quit smoking- Dec 08, 2019- [started in 50s]; light beer/ wine daily; 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    Social Drivers of Corporate investment banker Strain: Low Risk  (02/21/2023)   Received from [facility]   Overall Financial  Resource Strain (CARDIA)    Difficulty of Paying Living Expenses: Not hard at all  Food Insecurity: No Food Insecurity (02/21/2023)   Received from [facility]   Hunger Vital Sign    Within the past 12 months, you worried that your food would run out before you got the money to buy more.: Never true    Within the past 12 months, the food you bought just didn't last and you didn't have money to get more.: Never true  Transportation Needs: No Transportation Needs (02/21/2023)   Received from [facility]   PRAPARE - Transportation    Lack of Transportation (Non-Medical): No    In the past 12 months, has lack of transportation kept you from medical appointments or from getting medications?: No  Physical Activity: Not on file  Stress: Not on file  Social Connections: Not on file  Intimate Partner Violence: Not on file    FAMILY HISTORY: Family History  Problem Relation Age of Onset   Breast cancer Paternal Aunt    Hypertension Mother    Hyperlipidemia Mother    Cirrhosis Mother    Heart disease Father    Diabetes Father    Stroke Father     ALLERGIES:  is allergic to meperidine hcl and propoxyphene hcl.  MEDICATIONS:  Current Outpatient Medications  Medication Sig Dispense Refill   ALPRAZolam  (XANAX ) 0.25 MG tablet Take 1 tablet (0.25 mg total) by mouth 2 (two) times daily as needed. for anxiety (Patient taking differently: Take 0.25 mg by mouth at bedtime. for anxiety) 45 tablet 4   aspirin EC 81 MG tablet Take 81 mg by mouth daily.     atorvastatin  (LIPITOR ) 80 MG tablet Take 80 mg by mouth daily at 6 PM.      azelastine (ASTELIN) 0.1 % nasal spray Place 2 sprays into the nose.     calcium  carbonate (TUMS - DOSED IN MG ELEMENTAL CALCIUM ) 500 MG chewable tablet Chew 1-2 tablets by mouth daily as needed for indigestion or heartburn.     cetirizine (ZYRTEC) 10 MG tablet Take 10 mg by mouth daily after breakfast.      Cholecalciferol  (VITAMIN D)  50 MCG (2000 UT) tablet Take 2,000 Units by mouth daily with lunch.      clobetasol ointment (TEMOVATE) 0.05 % Apply 1 application topically 2 (two) times daily as needed (psoriasis).      EPINEPHrine  0.3 mg/0.3 mL IJ SOAJ injection Inject 0.3 mLs into the muscle as needed.     fluticasone  (FLONASE ) 50 MCG/ACT nasal spray Place 2 sprays into both nostrils daily as needed.      hydrochlorothiazide (HYDRODIURIL) 25 MG tablet Take 25 mg by mouth daily.     losartan-hydrochlorothiazide (HYZAAR) 100-12.5 MG tablet      montelukast (SINGULAIR) 10 MG tablet Take 10 mg by mouth at bedtime.  Multiple Vitamin (MULTI-VITAMIN) tablet Take 1 tablet by mouth daily.     pantoprazole (PROTONIX) 40 MG tablet Take by mouth.     Propylene Glycol (SYSTANE COMPLETE) 0.6 % SOLN Apply 1 drop to eye daily as needed. 1 gtt each eye as needed for dry eyes     Sodium Chloride -Sodium Bicarb (NETI POT SINUS WASH NA) Place 1 Dose into the nose daily as needed (congestion).     No current facility-administered medications for this visit.    REVIEW OF SYSTEMS:   Constitutional: ( - ) fevers, ( - )  chills , ( - ) night sweats Eyes: ( - ) blurriness of vision, ( - ) double vision, ( - ) watery eyes Ears, nose, mouth, throat, and face: ( - ) mucositis, ( - ) sore throat Respiratory: ( - ) cough, ( - ) dyspnea, ( - ) wheezes Cardiovascular: ( - ) palpitation, ( - ) chest discomfort, ( - ) lower extremity swelling Gastrointestinal:  ( - ) nausea, ( - ) heartburn, ( - ) change in bowel habits Skin: ( - ) abnormal skin rashes Lymphatics: ( - ) new lymphadenopathy, ( - ) easy bruising Neurological: ( - ) numbness, ( - ) tingling, ( - ) new weaknesses Behavioral/Psych: ( - ) mood change, ( - ) new changes  All other systems were reviewed with the patient and are negative.  PHYSICAL EXAMINATION: ECOG PERFORMANCE STATUS: {CHL ONC ECOG [phone]}  There were no vitals filed for this visit. There were no vitals filed for  this visit.  GENERAL: well appearing *** in NAD  SKIN: skin color, texture, turgor are normal, no rashes or significant lesions EYES: conjunctiva are pink and non-injected, sclera clear OROPHARYNX: no exudate, no erythema; lips, buccal mucosa, and tongue normal  NECK: supple, non-tender LYMPH:  no palpable lymphadenopathy in the cervical, axillary or supraclavicular lymph nodes.  LUNGS: clear to auscultation and percussion with normal breathing effort HEART: regular rate & rhythm and no murmurs and no lower extremity edema ABDOMEN: soft, non-tender, non-distended, normal bowel sounds Musculoskeletal: no cyanosis of digits and no clubbing  PSYCH: alert & oriented x 3, fluent speech NEURO: no focal motor/sensory deficits  LABORATORY DATA:  I have reviewed the data as listed    Latest Ref Rng & Units 08/18/2023   12:46 PM 02/17/2023   10:25 AM 08/16/2022    2:07 PM  CBC  WBC 4.0 - 10.5 K/uL 4.9  4.9  5.5   Hemoglobin 12.0 - 15.0 g/dL 87.0  86.6  85.2   Hematocrit 36.0 - 46.0 % 36.1  37.4  41.0   Platelets 150 - 400 K/uL 230  232  245        Latest Ref Rng & Units 08/18/2023   12:46 PM 02/17/2023   10:25 AM 08/16/2022    2:07 PM  CMP  Glucose 70 - 99 mg/dL 891  878  886   BUN 8 - 23 mg/dL 9  10  9    Creatinine 0.44 - 1.00 mg/dL 9.45  9.41  9.47   Sodium 135 - 145 mmol/L 132  132  132   Potassium 3.5 - 5.1 mmol/L 3.9  3.3  3.2   Chloride 98 - 111 mmol/L 96  97  93   CO2 22 - 32 mmol/L 27  27  29    Calcium  8.9 - 10.3 mg/dL 8.9  8.7  9.2   Total Protein 6.5 - 8.1 g/dL 7.1  7.1  7.9  Total Bilirubin 0.0 - 1.2 mg/dL 0.7  0.7  0.8   Alkaline Phos 38 - 126 U/L 78  82  97   AST 15 - 41 U/L 34  32  34   ALT 0 - 44 U/L 37  33  34     RADIOGRAPHIC STUDIES: I have personally reviewed the radiological images as listed and agreed with the findings in the report. CT Chest Wo Contrast Result Date: 02/04/2024 CLINICAL DATA:  Non-small-cell lung cancer restaging, assess treatment  response, status post left upper lobectomy * Tracking Code: BO * EXAM: CT CHEST WITHOUT CONTRAST TECHNIQUE: Multidetector CT imaging of the chest was performed following the standard protocol without IV contrast. RADIATION DOSE REDUCTION: This exam was performed according to the departmental dose-optimization program which includes automated exposure control, adjustment of the mA and/or kV according to patient size and/or use of iterative reconstruction technique. COMPARISON:  02/10/2023 FINDINGS: Cardiovascular: No significant vascular findings. Normal heart size. Scattered left and right coronary artery calcifications. No pericardial effusion. Mediastinum/Nodes: No enlarged mediastinal, hilar, or axillary lymph nodes. Small hiatal hernia. Thyroid gland, trachea, and esophagus demonstrate no significant findings. Lungs/Pleura: Status post left upper lobectomy. Unchanged 0.5 cm nodule of the peripheral lateral segment right middle lobe (series 3, image 68). No pleural effusion or pneumothorax. Upper Abdomen: No acute abnormality.  Cholecystectomy. Musculoskeletal: No chest wall abnormality. No acute osseous findings. IMPRESSION: 1. Status post left upper lobectomy. 2. Unchanged 0.5 cm nodule of the peripheral lateral segment right middle lobe, most likely benign and incidental. Attention on follow-up given history of malignancy. 3. No evidence of recurrent or metastatic disease in the chest. 4. Coronary artery disease. Electronically Signed   By: Marolyn JONETTA Jaksch M.D.   On: 02/04/2024 15:11    ASSESSMENT & PLAN Tabitha Knight is a 68 y.o. female who presents to the clinic for stage 1 lung cancer.   #LUL lung cancer: --S/p left upper lobectomy on 01/2023.  --No adjuvant therapy --Currently on surveillance.  --CT chest from 02/01/24 showed no evidence of recurrence or metastatic disease.  --Labs today show ***   No orders of the defined types were placed in this encounter.   All questions were  answered. The patient knows to call the clinic with any problems, questions or concerns.  I have spent a total of {CHL ONC TIME VISIT - DTPQU:[id number]} minutes of face-to-face and non-face-to-face time, preparing to see the patient, obtaining and/or reviewing separately obtained history, performing a medically appropriate examination, counseling and educating the patient, ordering medications/tests/procedures, referring and communicating with other health care professionals, documenting clinical information in the electronic health record, independently interpreting results and communicating results to the patient, and care coordination.   Johnston Police, PA-C Department of Hematology/Oncology [facility] Phone: [phone]

## 2024-02-15 ENCOUNTER — Inpatient Hospital Stay: Admitting: Physician Assistant

## 2024-02-15 ENCOUNTER — Inpatient Hospital Stay: Attending: Internal Medicine

## 2024-02-15 ENCOUNTER — Other Ambulatory Visit

## 2024-02-15 ENCOUNTER — Ambulatory Visit: Admitting: Nurse Practitioner

## 2024-02-15 ENCOUNTER — Ambulatory Visit: Admitting: Internal Medicine

## 2024-02-15 ENCOUNTER — Encounter: Payer: Self-pay | Admitting: Physician Assistant

## 2024-02-15 ENCOUNTER — Ambulatory Visit: Admitting: Physician Assistant

## 2024-02-15 VITALS — BP 133/69 | HR 76 | Temp 97.0°F | Resp 18 | Ht 61.0 in | Wt 137.6 lb

## 2024-02-15 DIAGNOSIS — C3412 Malignant neoplasm of upper lobe, left bronchus or lung: Secondary | ICD-10-CM

## 2024-02-15 DIAGNOSIS — R911 Solitary pulmonary nodule: Secondary | ICD-10-CM | POA: Diagnosis not present

## 2024-02-15 DIAGNOSIS — Z85118 Personal history of other malignant neoplasm of bronchus and lung: Secondary | ICD-10-CM | POA: Diagnosis present

## 2024-02-15 LAB — CBC WITH DIFFERENTIAL (CANCER CENTER ONLY)
Abs Immature Granulocytes: 0.02 K/uL (ref 0.00–0.07)
Basophils Absolute: 0 K/uL (ref 0.0–0.1)
Basophils Relative: 1 %
Eosinophils Absolute: 0.1 K/uL (ref 0.0–0.5)
Eosinophils Relative: 1 %
HCT: 35.2 % — ABNORMAL LOW (ref 36.0–46.0)
Hemoglobin: 12.5 g/dL (ref 12.0–15.0)
Immature Granulocytes: 0 %
Lymphocytes Relative: 24 %
Lymphs Abs: 1.5 K/uL (ref 0.7–4.0)
MCH: 31.3 pg (ref 26.0–34.0)
MCHC: 35.5 g/dL (ref 30.0–36.0)
MCV: 88.2 fL (ref 80.0–100.0)
Monocytes Absolute: 0.7 K/uL (ref 0.1–1.0)
Monocytes Relative: 11 %
Neutro Abs: 3.9 K/uL (ref 1.7–7.7)
Neutrophils Relative %: 63 %
Platelet Count: 228 K/uL (ref 150–400)
RBC: 3.99 MIL/uL (ref 3.87–5.11)
RDW: 11.4 % — ABNORMAL LOW (ref 11.5–15.5)
WBC Count: 6.3 K/uL (ref 4.0–10.5)
nRBC: 0 % (ref 0.0–0.2)

## 2024-02-15 LAB — CMP (CANCER CENTER ONLY)
ALT: 31 U/L (ref 0–44)
AST: 32 U/L (ref 15–41)
Albumin: 4.3 g/dL (ref 3.5–5.0)
Alkaline Phosphatase: 83 U/L (ref 38–126)
Anion gap: 10 (ref 5–15)
BUN: 11 mg/dL (ref 8–23)
CO2: 27 mmol/L (ref 22–32)
Calcium: 8.9 mg/dL (ref 8.9–10.3)
Chloride: 94 mmol/L — ABNORMAL LOW (ref 98–111)
Creatinine: 0.48 mg/dL (ref 0.44–1.00)
GFR, Estimated: >60 mL/min (ref >60)
Glucose, Bld: 104 mg/dL — ABNORMAL HIGH (ref 70–99)
Potassium: 3.6 mmol/L (ref 3.5–5.1)
Sodium: 131 mmol/L — ABNORMAL LOW (ref 135–145)
Total Bilirubin: 1.2 mg/dL (ref 0.0–1.2)
Total Protein: 7 g/dL (ref 6.5–8.1)

## 2024-02-15 NOTE — Progress Notes (Signed)
 CT chest 02/01/24.

## 2024-03-18 ENCOUNTER — Other Ambulatory Visit: Payer: Self-pay | Admitting: Certified Nurse Midwife

## 2024-03-18 DIAGNOSIS — Z1231 Encounter for screening mammogram for malignant neoplasm of breast: Secondary | ICD-10-CM

## 2024-08-20 ENCOUNTER — Inpatient Hospital Stay: Admitting: Internal Medicine

## 2024-08-20 ENCOUNTER — Inpatient Hospital Stay
